# Patient Record
Sex: Male | Born: 1935 | Race: White | Hispanic: No | Marital: Married | Smoking: Never smoker
Health system: Southern US, Community
[De-identification: ages and names within clinical notes are randomized; demographics above are authoritative.]

## PROBLEM LIST (undated history)

## (undated) DIAGNOSIS — I4891 Unspecified atrial fibrillation: Secondary | ICD-10-CM

## (undated) DIAGNOSIS — I10 Essential (primary) hypertension: Secondary | ICD-10-CM

## (undated) DIAGNOSIS — K469 Unspecified abdominal hernia without obstruction or gangrene: Secondary | ICD-10-CM

## (undated) HISTORY — PX: HERNIA REPAIR: SHX51

---

## 2004-08-08 ENCOUNTER — Ambulatory Visit: Payer: Self-pay | Admitting: Surgery

## 2004-08-14 ENCOUNTER — Ambulatory Visit: Payer: Self-pay | Admitting: Surgery

## 2005-02-05 ENCOUNTER — Ambulatory Visit: Payer: Self-pay | Admitting: Ophthalmology

## 2005-02-12 ENCOUNTER — Ambulatory Visit: Payer: Self-pay | Admitting: Ophthalmology

## 2005-03-12 ENCOUNTER — Ambulatory Visit: Payer: Self-pay | Admitting: Ophthalmology

## 2006-04-13 ENCOUNTER — Other Ambulatory Visit: Payer: Self-pay

## 2006-04-13 ENCOUNTER — Inpatient Hospital Stay: Payer: Self-pay | Admitting: Vascular Surgery

## 2006-07-04 ENCOUNTER — Ambulatory Visit: Payer: Self-pay | Admitting: Gastroenterology

## 2006-10-22 ENCOUNTER — Ambulatory Visit: Payer: Self-pay | Admitting: Gastroenterology

## 2008-12-31 ENCOUNTER — Ambulatory Visit: Payer: Self-pay | Admitting: Internal Medicine

## 2009-01-31 ENCOUNTER — Inpatient Hospital Stay: Payer: Self-pay | Admitting: Internal Medicine

## 2009-06-10 ENCOUNTER — Ambulatory Visit: Payer: Self-pay | Admitting: Rheumatology

## 2013-06-16 ENCOUNTER — Encounter: Payer: Self-pay | Admitting: Unknown Physician Specialty

## 2013-06-22 ENCOUNTER — Encounter: Payer: Self-pay | Admitting: Unknown Physician Specialty

## 2013-07-23 ENCOUNTER — Encounter: Payer: Self-pay | Admitting: Unknown Physician Specialty

## 2014-11-10 ENCOUNTER — Ambulatory Visit
Admit: 2014-11-10 | Disposition: A | Discharge status: Home / Self Care | Payer: Self-pay | Attending: Internal Medicine | Admitting: Internal Medicine

## 2014-12-07 ENCOUNTER — Other Ambulatory Visit: Payer: Self-pay | Admitting: Podiatry

## 2014-12-07 DIAGNOSIS — S92252A Displaced fracture of navicular [scaphoid] of left foot, initial encounter for closed fracture: Secondary | ICD-10-CM

## 2014-12-10 ENCOUNTER — Ambulatory Visit
Admission: RE | Admit: 2014-12-10 | Discharge: 2014-12-10 | Disposition: A | Discharge status: Home / Self Care | Payer: Commercial Managed Care - HMO | Source: Ambulatory Visit | Attending: Podiatry | Admitting: Podiatry

## 2014-12-10 DIAGNOSIS — X58XXXA Exposure to other specified factors, initial encounter: Secondary | ICD-10-CM | POA: Insufficient documentation

## 2014-12-10 DIAGNOSIS — S92252A Displaced fracture of navicular [scaphoid] of left foot, initial encounter for closed fracture: Secondary | ICD-10-CM | POA: Insufficient documentation

## 2015-12-27 ENCOUNTER — Emergency Department: Payer: Medicare HMO

## 2015-12-27 ENCOUNTER — Inpatient Hospital Stay
Admission: EM | Admit: 2015-12-27 | Discharge: 2015-12-31 | DRG: 482 | Disposition: A | Discharge status: Skilled Nursing Facility | Payer: Medicare HMO | Attending: Internal Medicine | Admitting: Internal Medicine

## 2015-12-27 ENCOUNTER — Encounter: Payer: Self-pay | Admitting: *Deleted

## 2015-12-27 DIAGNOSIS — N400 Enlarged prostate without lower urinary tract symptoms: Secondary | ICD-10-CM | POA: Diagnosis present

## 2015-12-27 DIAGNOSIS — M109 Gout, unspecified: Secondary | ICD-10-CM | POA: Diagnosis present

## 2015-12-27 DIAGNOSIS — Z7951 Long term (current) use of inhaled steroids: Secondary | ICD-10-CM | POA: Diagnosis not present

## 2015-12-27 DIAGNOSIS — Y92007 Garden or yard of unspecified non-institutional (private) residence as the place of occurrence of the external cause: Secondary | ICD-10-CM | POA: Diagnosis not present

## 2015-12-27 DIAGNOSIS — E785 Hyperlipidemia, unspecified: Secondary | ICD-10-CM | POA: Diagnosis present

## 2015-12-27 DIAGNOSIS — S72142A Displaced intertrochanteric fracture of left femur, initial encounter for closed fracture: Secondary | ICD-10-CM | POA: Diagnosis present

## 2015-12-27 DIAGNOSIS — I480 Paroxysmal atrial fibrillation: Secondary | ICD-10-CM | POA: Diagnosis present

## 2015-12-27 DIAGNOSIS — D649 Anemia, unspecified: Secondary | ICD-10-CM | POA: Diagnosis present

## 2015-12-27 DIAGNOSIS — I482 Chronic atrial fibrillation: Secondary | ICD-10-CM | POA: Diagnosis present

## 2015-12-27 DIAGNOSIS — Y93H3 Activity, building and construction: Secondary | ICD-10-CM

## 2015-12-27 DIAGNOSIS — Z419 Encounter for procedure for purposes other than remedying health state, unspecified: Secondary | ICD-10-CM

## 2015-12-27 DIAGNOSIS — I129 Hypertensive chronic kidney disease with stage 1 through stage 4 chronic kidney disease, or unspecified chronic kidney disease: Secondary | ICD-10-CM | POA: Diagnosis present

## 2015-12-27 DIAGNOSIS — M1990 Unspecified osteoarthritis, unspecified site: Secondary | ICD-10-CM | POA: Diagnosis present

## 2015-12-27 DIAGNOSIS — W11XXXA Fall on and from ladder, initial encounter: Secondary | ICD-10-CM | POA: Diagnosis present

## 2015-12-27 DIAGNOSIS — Z91040 Latex allergy status: Secondary | ICD-10-CM | POA: Diagnosis not present

## 2015-12-27 DIAGNOSIS — S72009A Fracture of unspecified part of neck of unspecified femur, initial encounter for closed fracture: Secondary | ICD-10-CM | POA: Diagnosis present

## 2015-12-27 HISTORY — DX: Unspecified abdominal hernia without obstruction or gangrene: K46.9

## 2015-12-27 HISTORY — DX: Essential (primary) hypertension: I10

## 2015-12-27 HISTORY — DX: Unspecified atrial fibrillation: I48.91

## 2015-12-27 LAB — TYPE AND SCREEN
ABO/RH(D): O POS
Antibody Screen: NEGATIVE

## 2015-12-27 LAB — CBC WITH DIFFERENTIAL/PLATELET
BASOS ABS: 0 10*3/uL (ref 0–0.1)
Basophils Relative: 0 %
EOS PCT: 2 %
Eosinophils Absolute: 0.2 10*3/uL (ref 0–0.7)
HCT: 37.4 % — ABNORMAL LOW (ref 40.0–52.0)
HEMOGLOBIN: 12.5 g/dL — AB (ref 13.0–18.0)
LYMPHS ABS: 1.6 10*3/uL (ref 1.0–3.6)
Lymphocytes Relative: 18 %
MCH: 34.4 pg — ABNORMAL HIGH (ref 26.0–34.0)
MCHC: 33.4 g/dL (ref 32.0–36.0)
MCV: 103.2 fL — AB (ref 80.0–100.0)
Monocytes Absolute: 0.6 10*3/uL (ref 0.2–1.0)
Monocytes Relative: 7 %
NEUTROS PCT: 73 %
Neutro Abs: 6.6 10*3/uL — ABNORMAL HIGH (ref 1.4–6.5)
PLATELETS: 171 10*3/uL (ref 150–440)
RBC: 3.63 MIL/uL — AB (ref 4.40–5.90)
RDW: 13.6 % (ref 11.5–14.5)
WBC: 9 10*3/uL (ref 3.8–10.6)

## 2015-12-27 LAB — BASIC METABOLIC PANEL
Anion gap: 10 (ref 5–15)
BUN: 37 mg/dL — AB (ref 6–20)
CHLORIDE: 110 mmol/L (ref 101–111)
CO2: 18 mmol/L — ABNORMAL LOW (ref 22–32)
Calcium: 9 mg/dL (ref 8.9–10.3)
Creatinine, Ser: 1.97 mg/dL — ABNORMAL HIGH (ref 0.61–1.24)
GFR, EST AFRICAN AMERICAN: 34 mL/min — AB (ref >60)
GFR, EST NON AFRICAN AMERICAN: 29 mL/min — AB (ref >60)
Glucose, Bld: 120 mg/dL — ABNORMAL HIGH (ref 65–99)
POTASSIUM: 4.7 mmol/L (ref 3.5–5.1)
SODIUM: 138 mmol/L (ref 135–145)

## 2015-12-27 LAB — PROTIME-INR
INR: 0.99
Prothrombin Time: 13.3 seconds (ref 11.4–15.0)

## 2015-12-27 LAB — APTT: APTT: 30 s (ref 24–36)

## 2015-12-27 LAB — ALBUMIN: Albumin: 4 g/dL (ref 3.5–5.0)

## 2015-12-27 MED ORDER — MORPHINE SULFATE (PF) 2 MG/ML IV SOLN: 1.0000 mg | INTRAVENOUS | Status: DC | PRN

## 2015-12-27 MED ORDER — ACETAMINOPHEN 325 MG PO TABS: 650.0000 mg | ORAL_TABLET | Freq: Four times a day (QID) | ORAL | Status: DC | PRN

## 2015-12-27 MED ORDER — MORPHINE SULFATE (PF) 2 MG/ML IV SOLN
2.0000 mg | INTRAVENOUS | Status: DC | PRN
  Administered 2015-12-27: 2 mg via INTRAVENOUS
  Filled 2015-12-27: qty 1

## 2015-12-27 MED ORDER — VITAMIN D 1000 UNITS PO TABS
1000.0000 [IU] | ORAL_TABLET | Freq: Every day | ORAL | Status: DC
  Filled 2015-12-27: qty 1

## 2015-12-27 MED ORDER — OXYCODONE HCL 5 MG PO TABS
5.0000 mg | ORAL_TABLET | ORAL | Status: DC | PRN
  Administered 2015-12-28 (×2): 10 mg via ORAL
  Filled 2015-12-27 (×2): qty 2

## 2015-12-27 MED ORDER — FEBUXOSTAT 40 MG PO TABS
40.0000 mg | ORAL_TABLET | Freq: Every day | ORAL | Status: DC
Start: 2015-12-28 — End: 2015-12-31
  Administered 2015-12-29 – 2015-12-31 (×3): 40 mg via ORAL
  Filled 2015-12-27 (×3): qty 1

## 2015-12-27 MED ORDER — ACETAMINOPHEN 650 MG RE SUPP: 650.0000 mg | Freq: Four times a day (QID) | RECTAL | Status: DC | PRN

## 2015-12-27 MED ORDER — VITAMIN C 500 MG PO TABS
500.0000 mg | ORAL_TABLET | Freq: Every day | ORAL | Status: DC
Start: 2015-12-28 — End: 2015-12-31
  Administered 2015-12-29 – 2015-12-31 (×3): 500 mg via ORAL
  Filled 2015-12-27 (×3): qty 1

## 2015-12-27 MED ORDER — CEFAZOLIN SODIUM-DEXTROSE 2-4 GM/100ML-% IV SOLN
2.0000 g | INTRAVENOUS | Status: AC
  Administered 2015-12-28: 2 g via INTRAVENOUS
  Filled 2015-12-27 (×2): qty 100

## 2015-12-27 MED ORDER — HYDROCODONE-ACETAMINOPHEN 5-325 MG PO TABS: 1.0000 | ORAL_TABLET | ORAL | Status: DC | PRN

## 2015-12-27 MED ORDER — MORPHINE SULFATE (PF) 2 MG/ML IV SOLN
2.0000 mg | Freq: Once | INTRAVENOUS | Status: AC
  Administered 2015-12-27: 2 mg via INTRAVENOUS

## 2015-12-27 MED ORDER — MORPHINE SULFATE (PF) 4 MG/ML IV SOLN: 4.0000 mg | INTRAVENOUS | Status: DC | PRN

## 2015-12-27 MED ORDER — DILTIAZEM HCL ER COATED BEADS 120 MG PO CP24: 120.0000 mg | ORAL_CAPSULE | Freq: Every day | ORAL | Status: DC

## 2015-12-27 MED ORDER — VITAMIN B-12 1000 MCG PO TABS
1000.0000 ug | ORAL_TABLET | Freq: Every day | ORAL | Status: DC
  Administered 2015-12-29 – 2015-12-31 (×3): 1000 ug via ORAL
  Filled 2015-12-27 (×3): qty 1

## 2015-12-27 MED ORDER — SENNOSIDES-DOCUSATE SODIUM 8.6-50 MG PO TABS: 1.0000 | ORAL_TABLET | Freq: Every evening | ORAL | Status: DC | PRN

## 2015-12-27 MED ORDER — MORPHINE SULFATE (PF) 2 MG/ML IV SOLN
INTRAVENOUS | Status: AC
  Filled 2015-12-27: qty 1

## 2015-12-27 MED ORDER — ADULT MULTIVITAMIN W/MINERALS CH
1.0000 | ORAL_TABLET | Freq: Every day | ORAL | Status: DC
  Administered 2015-12-29 – 2015-12-31 (×3): 1 via ORAL
  Filled 2015-12-27 (×3): qty 1

## 2015-12-27 MED ORDER — BISACODYL 5 MG PO TBEC: 5.0000 mg | DELAYED_RELEASE_TABLET | Freq: Every day | ORAL | Status: DC | PRN

## 2015-12-27 MED ORDER — FINASTERIDE 5 MG PO TABS
5.0000 mg | ORAL_TABLET | Freq: Every day | ORAL | Status: DC
  Administered 2015-12-29 – 2015-12-31 (×3): 5 mg via ORAL
  Filled 2015-12-27 (×3): qty 1

## 2015-12-27 MED ORDER — FLUTICASONE PROPIONATE 50 MCG/ACT NA SUSP
2.0000 | Freq: Every evening | NASAL | Status: DC | PRN
  Filled 2015-12-27: qty 16

## 2015-12-27 MED ORDER — SODIUM CHLORIDE 0.9 % IV SOLN
INTRAVENOUS | Status: DC
  Administered 2015-12-27 – 2015-12-28 (×2): via INTRAVENOUS

## 2015-12-27 MED ORDER — VITAMIN E 180 MG (400 UNIT) PO CAPS
400.0000 [IU] | ORAL_CAPSULE | Freq: Every day | ORAL | Status: DC
  Administered 2015-12-29 – 2015-12-31 (×3): 400 [IU] via ORAL
  Filled 2015-12-27 (×3): qty 1

## 2015-12-27 MED ORDER — HYDROMORPHONE HCL 1 MG/ML IJ SOLN
0.5000 mg | Freq: Once | INTRAMUSCULAR | Status: AC
  Administered 2015-12-27: 0.5 mg via INTRAVENOUS
  Filled 2015-12-27: qty 1

## 2015-12-27 NOTE — ED Notes (Signed)
MD Patel at bedside.

## 2015-12-27 NOTE — ED Notes (Signed)
C-collar  Removed by Dr. Cyril LoosenKinner.

## 2015-12-27 NOTE — ED Provider Notes (Signed)
[facility] Emergency Department Provider Note  ____________________________________________    I have reviewed the triage vital signs and the nursing notes.   HISTORY  Chief Complaint Fall    HPI Caleb Walker is a 82 y.o. male who presents after a fall. Patient reports that he was on the second rung of a ladder as he is about to repair his roof but lost his balance and fell backwards and landed on his left hip and left elbow. He complains of significant sharp pain in his left hip when he moves it and is unable to ambulate. He denies head injury. No neck injury   Past Medical History  Diagnosis Date  . Atrial fibrillation (HCC)   . Hypertension   . Hernia, abdominal     There are no active problems to display for this patient.   Past Surgical History  Procedure Laterality Date  . Hernia repair      Current Outpatient Rx  Name  Route  Sig  Dispense  Refill  . aspirin EC 81 MG tablet   Oral   Take 81 mg by mouth daily.         Marland Kitchen diltiazem (CARDIZEM CD) 120 MG 24 hr capsule   Oral   Take 120 mg by mouth daily.         . Febuxostat (ULORIC) 80 MG TABS   Oral   Take 80 mg by mouth daily.         . finasteride (PROSCAR) 5 MG tablet   Oral   Take 5 mg by mouth daily.         . fluticasone (FLONASE) 50 MCG/ACT nasal spray   Each Nare   Place 2 sprays into both nostrils daily.         Marland Kitchen lisinopril-hydrochlorothiazide (PRINZIDE,ZESTORETIC) 20-12.5 MG tablet   Oral   Take 1 tablet by mouth daily.         . Multiple Vitamin (MULTIVITAMIN WITH MINERALS) TABS tablet   Oral   Take 1 tablet by mouth daily.         . vitamin B-12 (CYANOCOBALAMIN) 1000 MCG tablet   Oral   Take 1,000 mcg by mouth daily.           Allergies Latex  No family history on file.  Social History Social History  Substance Use Topics  . Smoking status: Never Smoker   . Smokeless tobacco: None  . Alcohol Use: No    Review of  Systems  Constitutional: Negative for fever. Eyes: Negative for redness ENT: Negative for sore throat Cardiovascular: Negative for chest pain Respiratory: Negative for shortness of breath. Gastrointestinal: Negative for abdominal pain Genitourinary: Negative for dysuria. Musculoskeletal: Left hip pain, no neck pain Skin: Abrasion to left elbow Neurological: Negative for focal weakness Psychiatric: no anxiety    ____________________________________________   PHYSICAL EXAM:  VITAL SIGNS: ED Triage Vitals  Enc Vitals Group     BP 12/27/15 1620 147/53 mmHg     Pulse Rate 12/27/15 1620 59     Resp 12/27/15 1620 18     Temp 12/27/15 1620 97.4 F (36.3 C)     Temp Source 12/27/15 1620 Oral     SpO2 12/27/15 1620 99 %     Weight 12/27/15 1620 205 lb (92.987 kg)     Height 12/27/15 1620  (1.803 m)     Head Cir --      Peak Flow --      Pain  Score --      Pain Loc --      Pain Edu? --      Excl. in GC? --    Constitutional: Alert and oriented. Well appearing and in no distress.  Eyes: Conjunctivae are normal. No erythema or injection ENT   Head: Normocephalic and atraumatic.   Mouth/Throat: Mucous membranes are moist. Cardiovascular: Normal rate, regular rhythm. Normal and symmetric distal pulses are present in the upper extremities.  Respiratory: Normal respiratory effort without tachypnea nor retractions. Breath sounds are clear and equal bilaterally.  Gastrointestinal: Soft and non-tender in all quadrants. No distention. There is no CVA tenderness. Genitourinary: deferred Musculoskeletal: Normal range of motion except in left leg, patient holding it very still. 2+ distal DP pulse left leg. Mild tender to palpation over the left lateral hip, patient is unable to lift the leg without significant pain. No obvious shortening Neurologic:  Normal speech and language. No gross focal neurologic deficits are appreciated. Skin:  Skin is warm, dry and intact. No rash  noted. Psychiatric: Mood and affect are normal. Patient exhibits appropriate insight and judgment.  ____________________________________________    LABS (pertinent positives/negatives)  Labs Reviewed  BASIC METABOLIC PANEL - Abnormal; Notable for the following:    CO2 18 (*)    Glucose, Bld 120 (*)    BUN 37 (*)    Creatinine, Ser 1.97 (*)    GFR calc non Af Amer 29 (*)    GFR calc Af Amer 34 (*)    All other components within normal limits  CBC WITH DIFFERENTIAL/PLATELET - Abnormal; Notable for the following:    RBC 3.63 (*)    Hemoglobin 12.5 (*)    HCT 37.4 (*)    MCV 103.2 (*)    MCH 34.4 (*)    Neutro Abs 6.6 (*)    All other components within normal limits  PROTIME-INR  TYPE AND SCREEN    ____________________________________________   EKG  None  ____________________________________________    RADIOLOGY  Hip x-ray shows mildly displaced intertrochanteric left hip fracture  ____________________________________________   PROCEDURES  Procedure(s) performed: none  Critical Care performed: none  ____________________________________________   INITIAL IMPRESSION / ASSESSMENT AND PLAN / ED COURSE  Pertinent labs & imaging results that were available during my care of the patient were reviewed by me and considered in my medical decision making (see chart for details).  Patient's initial presentation is concerning for hip fracture, we will sent for x-rays and check labs.  ----------------------------------------- 5:41 PM on 12/27/2015 -----------------------------------------  Hip fracture confirmed on x-ray, discussed with Dr. Martha ClanKrasinski orthopedics and will admit to the hospitalist service. Patient remains pain-free and unless he moves his leg. Denied pain medications  ____________________________________________   FINAL CLINICAL IMPRESSION(S) / ED DIAGNOSES  Final diagnoses:  Intertrochanteric fracture of left hip, closed, initial encounter (HCC)           Jene Everyobert Thurley Francesconi, MD 12/27/15 [phone]

## 2015-12-27 NOTE — Progress Notes (Signed)
Pt. Still hurting 10 out of 10. Dilaudid 0.5mg  x1 ordered

## 2015-12-27 NOTE — Consult Note (Cosign Needed)
ORTHOPAEDIC CONSULTATION  REQUESTING PHYSICIAN: Enedina Finner, MD  Chief Complaint: Left hip pain  HPI: Caleb Walker is a 82 y.o. male who complains of left hip pain after a fall of the second rung of a ladder at home.  He states he doesn't know why he fell, but that maybe his left lower extremity "gave out" on him.  He landed on his left elbow and hip.  He couldn't stand after the fall.  He states that he has had weakness in his lower extremities prior to his fall today.   He has performed PT for this without resolution.  He states the etiology of his weakness is unknown.    Past Medical History  Diagnosis Date  . Atrial fibrillation (HCC)   . Hypertension   . Hernia, abdominal    Past Surgical History  Procedure Laterality Date  . Hernia repair     Social History   Social History  . Marital Status: Married    Spouse Name: N/A  . Number of Children: N/A  . Years of Education: N/A   Social History Main Topics  . Smoking status: Never Smoker   . Smokeless tobacco: None  . Alcohol Use: No  . Drug Use: None  . Sexual Activity: Not Asked   Other Topics Concern  . None   Social History Narrative  . None   No family history on file. Allergies  Allergen Reactions  . Latex Rash   Prior to Admission medications   Medication Sig Start Date End Date Taking? Authorizing Provider  cholecalciferol (VITAMIN D) 1000 units tablet Take 1,000 Units by mouth daily.   Yes Historical Provider, MD  diltiazem (CARDIZEM CD) 120 MG 24 hr capsule Take 120 mg by mouth daily.   Yes Historical Provider, MD  febuxostat (ULORIC) 40 MG tablet Take 40 mg by mouth daily.   Yes Historical Provider, MD  finasteride (PROSCAR) 5 MG tablet Take 5 mg by mouth daily.   Yes Historical Provider, MD  fluticasone (FLONASE) 50 MCG/ACT nasal spray Place 2 sprays into both nostrils at bedtime as needed for rhinitis.    Yes Historical Provider, MD  Multiple Vitamin (MULTIVITAMIN WITH MINERALS) TABS tablet Take 1  tablet by mouth daily.   Yes Historical Provider, MD  vitamin B-12 (CYANOCOBALAMIN) 1000 MCG tablet Take 1,000 mcg by mouth daily.   Yes Historical Provider, MD  vitamin C (ASCORBIC ACID) 500 MG tablet Take 500 mg by mouth daily.   Yes Historical Provider, MD  vitamin E 400 UNIT capsule Take 400 Units by mouth daily.   Yes Historical Provider, MD   Dg Chest 1 View  12/27/2015  CLINICAL DATA:  Left hip fracture.  Pre-op respiratory exam EXAM: CHEST 1 VIEW COMPARISON:  01/31/2009 FINDINGS: The heart size and mediastinal contours are within normal limits. Both lungs are clear. The visualized skeletal structures are unremarkable. IMPRESSION: No active disease. Electronically Signed   By: Myles Rosenthal M.D.   On: 12/27/2015 17:15   Dg Hip Unilat With Pelvis 2-3 Views Left  12/27/2015  CLINICAL DATA:  Fall off ladder. Left hip injury and pain. Initial encounter. EXAM: DG HIP (WITH OR WITHOUT PELVIS) 2-3V LEFT COMPARISON:  None. FINDINGS: Mildly displaced intertrochanteric left hip fracture is seen, which remains in near anatomic alignment. No evidence of hip dislocation. No pelvic fracture identified. Surgical mesh seen in right inguinal region. Generalized osteopenia noted. IMPRESSION: Mildly displaced intertrochanteric left hip fracture. Electronically Signed   By: Alver Sorrow.D.  On: 12/27/2015 17:16    Positive ROS: All other systems have been reviewed and were otherwise negative with the exception of those mentioned in the HPI and as above.  Physical Exam: General: Alert, no acute distress   MUSCULOSKELETAL: Left lower extremity:  It is intact. There is no erythema or ecchymosis or swelling. His thigh and lower leg compartments are soft and compressible. He has slight shortening and external rotation of the left lower extremity. His palpable pedal pulses, intact sensation light touch and intact motor function throughout both lower extremity.  Assessment: Left intertrochanteric hip  fracture  Plan: Patient will require surgical intervention for this intertrochanteric hip fracture in order for the patient to stand or ambulate. Patient is being admitted to the medical service for preoperative clearance. Patient is scheduled for surgery tomorrow morning pending medical clearance.   The risks, benefits and alternatives were discussed with the patient and their family.  The risks include but are not limited to infection, bleeding, nerve or blood vessel injury, malunion, nonunion, hardware prominence, hardware failure, change in leg lengths or lower extremity rotation need for further surgery including hardware removal with conversion to a total hip arthroplasty. Medical risks include but are not limited to DVT and pulmonary embolism, myocardial infarction, stroke, pneumonia, respiratory failure and death. The patient and their family understood these risks and wished to proceed with surgery.  I have reviewed the patient's laboratories as well as the radiographic studies in preparation for this surgery.  12/27/2015 6:36 PM

## 2015-12-27 NOTE — ED Notes (Signed)
Per EMS report, patient was standing on 2nd rung of ladder leaning against the house and fell backwards. Patient states he landed on left side and hit his head, elbow and hip. Patient then crawled to his stoop and sat there for approximately one hour until someone noticed him and could call for an ambulance. Patient has an abrasion on left elbow, and c/o left hip pain and could not weight bear for the EMS. Patient denies being on blood thinners. C-collar placed on patient upon arrival.

## 2015-12-27 NOTE — Progress Notes (Signed)
Pt. Hurting 10 out of 10 upon arrival from ED. Dr. Imogene Burnhen ordered morphine 2 mg now and 4mg  q4h prn

## 2015-12-27 NOTE — H&P (Signed)
[facility] [place] [facility]   PATIENT NAME: Caleb Walker Walker    MR#:  [id number]  DATE OF BIRTH:  06/09/1927  DATE OF ADMISSION:  12/27/2015  PRIMARY CARE PHYSICIAN: Lauro RegulusANDERSON,MARSHALL W., MD   REQUESTING/REFERRING PHYSICIAN: Dr. Cyril LoosenKinner  CHIEF COMPLAINT:  Left hip pain status post fall from a ladder today  HISTORY OF PRESENT ILLNESS:  Caleb Walker  is a 82 y.o. male with a known history of Paroxysmal A. fib, gout, tenacious anemia, osteoarthritis, hyperlipidemia, chronic kidney disease stage III carcinoid emergency room after he had sustained a fall at home this morning after he had fallen from the ladder. Patient was trying to fix his daughters and lost balance. He denies losing consciousness feeling dizzy lightheaded prior to getting up on the lateral. He was having his normal day. Denies any cardiac history other than paroxysmal A. fib. He is not on any blood thinners and does not even take an aspirin. Patient remains in sinus rhythm in the emergency room he is hemodynamically stable blood pressures a bit on the higher side secondary to hip pain and is being admitted for further evaluation and management of his left hip intertrochanteric fracture. No family is present emergency room.  PAST MEDICAL HISTORY:   Past Medical History  Diagnosis Date  . Atrial fibrillation (HCC)   . Hypertension   . Hernia, abdominal     PAST SURGICAL HISTOIRY:   Past Surgical History  Procedure Laterality Date  . Hernia repair      SOCIAL HISTORY:   Social History  Substance Use Topics  . Smoking status: Never Smoker   . Smokeless tobacco: Not on file  . Alcohol Use: No    FAMILY HISTORY:  No family history on file.  DRUG ALLERGIES:   Allergies  Allergen Reactions  . Latex Rash    REVIEW OF SYSTEMS:  Review of Systems  Constitutional: Negative for fever, chills and weight loss.  HENT: Negative for ear discharge, ear pain and nosebleeds.   Eyes:  Negative for blurred vision, pain and discharge.  Respiratory: Negative for sputum production, shortness of breath, wheezing and stridor.   Cardiovascular: Negative for chest pain, palpitations, orthopnea and PND.  Gastrointestinal: Negative for nausea, vomiting, abdominal pain and diarrhea.  Genitourinary: Negative for urgency and frequency.  Musculoskeletal: Positive for joint pain. Negative for back pain.  Neurological: Positive for weakness. Negative for sensory change, speech change and focal weakness.  Psychiatric/Behavioral: Negative for depression and hallucinations. The patient is not nervous/anxious.   All other systems reviewed and are negative.    MEDICATIONS AT HOME:   Prior to Admission medications   Medication Sig Start Date End Date Taking? Authorizing Provider  cholecalciferol (VITAMIN D) 1000 units tablet Take 1,000 Units by mouth daily.   Yes Historical Provider, MD  diltiazem (CARDIZEM CD) 120 MG 24 hr capsule Take 120 mg by mouth daily.   Yes Historical Provider, MD  febuxostat (ULORIC) 40 MG tablet Take 40 mg by mouth daily.   Yes Historical Provider, MD  finasteride (PROSCAR) 5 MG tablet Take 5 mg by mouth daily.   Yes Historical Provider, MD  fluticasone (FLONASE) 50 MCG/ACT nasal spray Place 2 sprays into both nostrils at bedtime as needed for rhinitis.    Yes Historical Provider, MD  Multiple Vitamin (MULTIVITAMIN WITH MINERALS) TABS tablet Take 1 tablet by mouth daily.   Yes Historical Provider, MD  vitamin B-12 (CYANOCOBALAMIN) 1000 MCG tablet Take 1,000 mcg by mouth daily.  Yes Historical Provider, MD  vitamin C (ASCORBIC ACID) 500 MG tablet Take 500 mg by mouth daily.   Yes Historical Provider, MD  vitamin E 400 UNIT capsule Take 400 Units by mouth daily.   Yes Historical Provider, MD      VITAL SIGNS:  Blood pressure 170/80, pulse 69, temperature 97.4 F (36.3 C), temperature source Oral, resp. rate 14, height  (1.803 m), weight 92.987 kg (205 lb),  SpO2 100 %.  PHYSICAL EXAMINATION:  GENERAL:  82 y.o.-year-old patient lying in the bed with no acute distress.  EYES: Pupils equal, round, reactive to light and accommodation. No scleral icterus. Extraocular muscles intact.  HEENT: Head atraumatic, normocephalic. Oropharynx and nasopharynx clear.  NECK:  Supple, no jugular venous distention. No thyroid enlargement, no tenderness.  LUNGS: Normal breath sounds bilaterally, no wheezing, rales,rhonchi or crepitation. No use of accessory muscles of respiration.  CARDIOVASCULAR: S1, S2 normal. No murmurs, rubs, or gallops.  ABDOMEN: Soft, nontender, nondistended. Bowel sounds present. No organomegaly or mass.  EXTREMITIES: No pedal edema, cyanosis, or clubbing. Decreased range of motion on left hip NEUROLOGIC: Cranial nerves II through XII are intact. Muscle strength 5/5 in 3 extremities but left hip. Sensation intact. Gait not checked.  PSYCHIATRIC: The patient is alert and oriented x 3.  SKIN: No obvious rash, lesion, or ulcer.   LABORATORY PANEL:   CBC  Recent Labs Lab 12/27/15 1634  WBC 9.0  HGB 12.5*  HCT 37.4*  PLT 171   ------------------------------------------------------------------------------------------------------------------  Chemistries   Recent Labs Lab 12/27/15 1634  NA 138  K 4.7  CL 110  CO2 18*  GLUCOSE 120*  BUN 37*  CREATININE 1.97*  CALCIUM 9.0   ------------------------------------------------------------------------------------------------------------------  Cardiac Enzymes No results for input(s): TROPONINI in the last 168 hours. ------------------------------------------------------------------------------------------------------------------  RADIOLOGY:  Dg Chest 1 View  12/27/2015  CLINICAL DATA:  Left hip fracture.  Pre-op respiratory exam EXAM: CHEST 1 VIEW COMPARISON:  01/31/2009 FINDINGS: The heart size and mediastinal contours are within normal limits. Both lungs are clear. The visualized  skeletal structures are unremarkable. IMPRESSION: No active disease. Electronically Signed   By: Myles Rosenthal M.D.   On: 12/27/2015 17:15   Dg Hip Unilat With Pelvis 2-3 Views Left  12/27/2015  CLINICAL DATA:  Fall off ladder. Left hip injury and pain. Initial encounter. EXAM: DG HIP (WITH OR WITHOUT PELVIS) 2-3V LEFT COMPARISON:  None. FINDINGS: Mildly displaced intertrochanteric left hip fracture is seen, which remains in near anatomic alignment. No evidence of hip dislocation. No pelvic fracture identified. Surgical mesh seen in right inguinal region. Generalized osteopenia noted. IMPRESSION: Mildly displaced intertrochanteric left hip fracture. Electronically Signed   By: Myles Rosenthal M.D.   On: 12/27/2015 17:16    EKG:   Normal sinus rhythm. No acute EKG changes IMPRESSION AND PLAN:   Caleb Walker  is a 82 y.o. male with a known history of Paroxysmal A. fib, gout, tenacious anemia, osteoarthritis, hyperlipidemia, chronic kidney disease stage III carcinoid emergency room after he had sustained a fall at home this morning after he had fallen from the ladder. Patient was trying to fix his daughters and lost balance. He denies losing consciousness feeling dizzy lightheaded prior to getting up on the lateral.   1. Acute left hip intertrochanteric fracture status post mechanical fall at home while patient was trying to climb up the ladder to clean his gutters -Patient does not have any cardiac history other than paroxysmal A. fib not on any blood thinners -  He is otherwise active and has good functional capacity. -EKG shows normal sinus rhythm -Patient is at a low to intermediate risk for surgery. Okay to proceed with surgery. Patient denies any chest pain or shortness of breath  2. Paroxysmal A. Fib -Currently in sinus rhythm -Continue calcium channel blocker  3. Hypertension Continue diltiazem -IV when necessary hydralazine  4. Chronic kidney disease stage III -IV fluids maintenance  5.  DVT prophylaxis SCD and teds. Will defer it to orthopedic to start Lovenox after surgery.  All the records are reviewed and case discussed with ED provider. Management plans discussed with the patient, family and they are in agreement.  CODE STATUS: Full this was discussed with patient  TOTAL TIME TAKING CARE OF THIS PATIENT: 50 minutes.    Bathsheba Durrett M.D on 12/27/2015 at 5:55 PM  Between 7am to 6pm - Pager - [phone]  After 6pm go to [email or web address] - password [email or web address]  [facility]  Office  [phone]  CC: Primary care physician; Lauro Regulus., MD

## 2015-12-27 NOTE — ED Notes (Signed)
Patient is back from x-ray and was given phone to call his son. Patient is alert and oriented, hard of hearing, pleasant and cooperative.

## 2015-12-28 ENCOUNTER — Inpatient Hospital Stay: Payer: Medicare HMO

## 2015-12-28 ENCOUNTER — Encounter
Admission: EM | Disposition: A | Discharge status: Skilled Nursing Facility | Payer: Self-pay | Source: Home / Self Care | Attending: Internal Medicine

## 2015-12-28 ENCOUNTER — Inpatient Hospital Stay: Payer: Medicare HMO | Admitting: Anesthesiology

## 2015-12-28 ENCOUNTER — Encounter: Payer: Self-pay | Admitting: *Deleted

## 2015-12-28 HISTORY — PX: INTRAMEDULLARY (IM) NAIL INTERTROCHANTERIC: SHX5875

## 2015-12-28 LAB — CBC
HCT: 32.5 % — ABNORMAL LOW (ref 40.0–52.0)
Hemoglobin: 10.9 g/dL — ABNORMAL LOW (ref 13.0–18.0)
MCH: 33.9 pg (ref 26.0–34.0)
MCHC: 33.4 g/dL (ref 32.0–36.0)
MCV: 101.3 fL — AB (ref 80.0–100.0)
PLATELETS: 157 10*3/uL (ref 150–440)
RBC: 3.21 MIL/uL — ABNORMAL LOW (ref 4.40–5.90)
RDW: 13.8 % (ref 11.5–14.5)
WBC: 11.1 10*3/uL — ABNORMAL HIGH (ref 3.8–10.6)

## 2015-12-28 LAB — SURGICAL PCR SCREEN
MRSA, PCR: NEGATIVE
STAPHYLOCOCCUS AUREUS: POSITIVE — AB

## 2015-12-28 SURGERY — FIXATION, FRACTURE, INTERTROCHANTERIC, WITH INTRAMEDULLARY ROD
Anesthesia: Regional | Laterality: Left

## 2015-12-28 MED ORDER — FENTANYL CITRATE (PF) 100 MCG/2ML IJ SOLN: 25.0000 ug | INTRAMUSCULAR | Status: DC | PRN

## 2015-12-28 MED ORDER — MENTHOL 3 MG MT LOZG
1.0000 | LOZENGE | OROMUCOSAL | Status: DC | PRN
  Filled 2015-12-28: qty 9

## 2015-12-28 MED ORDER — ONDANSETRON HCL 4 MG PO TABS: 4.0000 mg | ORAL_TABLET | Freq: Four times a day (QID) | ORAL | Status: DC | PRN

## 2015-12-28 MED ORDER — ACETAMINOPHEN 650 MG RE SUPP: 650.0000 mg | Freq: Four times a day (QID) | RECTAL | Status: DC | PRN

## 2015-12-28 MED ORDER — FENTANYL CITRATE (PF) 100 MCG/2ML IJ SOLN
INTRAMUSCULAR | Status: DC | PRN
  Administered 2015-12-28 (×2): 25 ug via INTRAVENOUS

## 2015-12-28 MED ORDER — ENOXAPARIN SODIUM 40 MG/0.4ML ~~LOC~~ SOLN
40.0000 mg | SUBCUTANEOUS | Status: DC
  Administered 2015-12-29 – 2015-12-31 (×3): 40 mg via SUBCUTANEOUS
  Filled 2015-12-28 (×3): qty 0.4

## 2015-12-28 MED ORDER — PHENOL 1.4 % MT LIQD
1.0000 | OROMUCOSAL | Status: DC | PRN
  Filled 2015-12-28: qty 177

## 2015-12-28 MED ORDER — ONDANSETRON HCL 4 MG/2ML IJ SOLN: 4.0000 mg | Freq: Four times a day (QID) | INTRAMUSCULAR | Status: DC | PRN

## 2015-12-28 MED ORDER — BUPIVACAINE HCL (PF) 0.5 % IJ SOLN
INTRAMUSCULAR | Status: DC | PRN
  Administered 2015-12-28: 3 mL

## 2015-12-28 MED ORDER — NEOMYCIN-POLYMYXIN B GU 40-200000 IR SOLN
Status: DC | PRN
  Administered 2015-12-28: 4 mL

## 2015-12-28 MED ORDER — PHENYLEPHRINE HCL 10 MG/ML IJ SOLN
INTRAMUSCULAR | Status: DC | PRN
  Administered 2015-12-28 (×4): 50 ug via INTRAVENOUS

## 2015-12-28 MED ORDER — NEOMYCIN-POLYMYXIN B GU 40-200000 IR SOLN
Status: AC
  Filled 2015-12-28: qty 4

## 2015-12-28 MED ORDER — SODIUM CHLORIDE FLUSH 0.9 % IV SOLN
INTRAVENOUS | Status: AC
Start: 2015-12-28 — End: 2015-12-28
  Filled 2015-12-28: qty 10

## 2015-12-28 MED ORDER — FERROUS SULFATE 325 (65 FE) MG PO TABS
325.0000 mg | ORAL_TABLET | Freq: Three times a day (TID) | ORAL | Status: DC
  Administered 2015-12-29 – 2015-12-31 (×7): 325 mg via ORAL
  Filled 2015-12-28 (×7): qty 1

## 2015-12-28 MED ORDER — PROPOFOL 500 MG/50ML IV EMUL
INTRAVENOUS | Status: DC | PRN
  Administered 2015-12-28: 40 ug/kg/min via INTRAVENOUS

## 2015-12-28 MED ORDER — SODIUM CHLORIDE 0.9 % IV SOLN
75.0000 mL/h | INTRAVENOUS | Status: DC
  Administered 2015-12-28 – 2015-12-29 (×2): 75 mL/h via INTRAVENOUS

## 2015-12-28 MED ORDER — CHLORHEXIDINE GLUCONATE CLOTH 2 % EX PADS: 6.0000 | MEDICATED_PAD | Freq: Every day | CUTANEOUS | Status: DC

## 2015-12-28 MED ORDER — HYDROMORPHONE HCL 1 MG/ML IJ SOLN
1.0000 mg | INTRAMUSCULAR | Status: DC | PRN
  Administered 2015-12-28: 1 mg via INTRAVENOUS
  Filled 2015-12-28 (×2): qty 1

## 2015-12-28 MED ORDER — MUPIROCIN 2 % EX OINT
1.0000 "application " | TOPICAL_OINTMENT | Freq: Two times a day (BID) | CUTANEOUS | Status: DC
  Filled 2015-12-28: qty 22

## 2015-12-28 MED ORDER — OXYCODONE HCL 5 MG PO TABS
10.0000 mg | ORAL_TABLET | ORAL | Status: DC | PRN
  Administered 2015-12-28 (×2): 10 mg via ORAL
  Administered 2015-12-29: 15 mg via ORAL
  Administered 2015-12-29 – 2015-12-31 (×5): 10 mg via ORAL
  Filled 2015-12-28 (×6): qty 2
  Filled 2015-12-28: qty 3
  Filled 2015-12-28 (×2): qty 2

## 2015-12-28 MED ORDER — EPHEDRINE SULFATE 50 MG/ML IJ SOLN
INTRAMUSCULAR | Status: DC | PRN
  Administered 2015-12-28: 10 mg via INTRAVENOUS
  Administered 2015-12-28: 5 mg via INTRAVENOUS
  Administered 2015-12-28 (×2): 10 mg via INTRAVENOUS

## 2015-12-28 MED ORDER — BISACODYL 10 MG RE SUPP
10.0000 mg | Freq: Every day | RECTAL | Status: DC | PRN
  Administered 2015-12-30: 10 mg via RECTAL
  Filled 2015-12-28: qty 1

## 2015-12-28 MED ORDER — ACETAMINOPHEN 325 MG PO TABS
650.0000 mg | ORAL_TABLET | Freq: Four times a day (QID) | ORAL | Status: DC | PRN
  Administered 2015-12-29: 650 mg via ORAL
  Filled 2015-12-28: qty 2

## 2015-12-28 MED ORDER — SENNA 8.6 MG PO TABS
1.0000 | ORAL_TABLET | Freq: Two times a day (BID) | ORAL | Status: DC
Start: 2015-12-28 — End: 2015-12-31
  Administered 2015-12-28 – 2015-12-31 (×6): 8.6 mg via ORAL
  Filled 2015-12-28 (×6): qty 1

## 2015-12-28 MED ORDER — POLYETHYLENE GLYCOL 3350 17 G PO PACK
17.0000 g | PACK | Freq: Every day | ORAL | Status: DC | PRN
  Administered 2015-12-29: 17 g via ORAL
  Filled 2015-12-28: qty 1

## 2015-12-28 MED ORDER — ALUM & MAG HYDROXIDE-SIMETH 200-200-20 MG/5ML PO SUSP: 30.0000 mL | ORAL | Status: DC | PRN

## 2015-12-28 MED ORDER — GLYCOPYRROLATE 0.2 MG/ML IJ SOLN
INTRAMUSCULAR | Status: DC | PRN
  Administered 2015-12-28: 0.2 mg via INTRAVENOUS

## 2015-12-28 MED ORDER — PROPOFOL 10 MG/ML IV BOLUS
INTRAVENOUS | Status: DC | PRN
  Administered 2015-12-28: 20 mg via INTRAVENOUS
  Administered 2015-12-28: 30 mg via INTRAVENOUS
  Administered 2015-12-28: 20 mg via INTRAVENOUS

## 2015-12-28 MED ORDER — MAGNESIUM CITRATE PO SOLN
1.0000 | Freq: Once | ORAL | Status: DC | PRN
  Filled 2015-12-28: qty 296

## 2015-12-28 MED ORDER — DOCUSATE SODIUM 100 MG PO CAPS
100.0000 mg | ORAL_CAPSULE | Freq: Two times a day (BID) | ORAL | Status: DC
  Administered 2015-12-28 – 2015-12-31 (×6): 100 mg via ORAL
  Filled 2015-12-28 (×6): qty 1

## 2015-12-28 MED ORDER — ONDANSETRON HCL 4 MG/2ML IJ SOLN: 4.0000 mg | Freq: Once | INTRAMUSCULAR | Status: DC | PRN

## 2015-12-28 MED ORDER — MORPHINE SULFATE (PF) 2 MG/ML IV SOLN: 2.0000 mg | INTRAVENOUS | Status: DC | PRN

## 2015-12-28 MED ORDER — LACTATED RINGERS IV SOLN
INTRAVENOUS | Status: DC
Start: 2015-12-28 — End: 2015-12-28
  Administered 2015-12-28: 09:00:00 via INTRAVENOUS

## 2015-12-28 MED ORDER — CEFAZOLIN SODIUM-DEXTROSE 2-4 GM/100ML-% IV SOLN
2.0000 g | Freq: Four times a day (QID) | INTRAVENOUS | Status: AC
  Administered 2015-12-28: 2 g via INTRAVENOUS
  Filled 2015-12-28 (×2): qty 100

## 2015-12-28 SURGICAL SUPPLY — 34 items
BIT DRILL CANN LG 4.3MM (BIT) ×1 IMPLANT
BNDG COHESIVE 6X5 TAN STRL LF (GAUZE/BANDAGES/DRESSINGS) ×6 IMPLANT
CANISTER SUCT 1200ML W/VALVE (MISCELLANEOUS) ×3 IMPLANT
DRAPE SHEET LG 3/4 BI-LAMINATE (DRAPES) ×6 IMPLANT
DRAPE SURG 17X11 SM STRL (DRAPES) ×6 IMPLANT
DRAPE U-SHAPE 47X51 STRL (DRAPES) ×3 IMPLANT
DRILL BIT CANN LG 4.3MM (BIT) ×3
DRSG OPSITE POSTOP 4X14 (GAUZE/BANDAGES/DRESSINGS) ×3 IMPLANT
DURAPREP 26ML APPLICATOR (WOUND CARE) ×6 IMPLANT
ELECT REM PT RETURN 9FT ADLT (ELECTROSURGICAL) ×3
ELECTRODE REM PT RTRN 9FT ADLT (ELECTROSURGICAL) ×1 IMPLANT
GLOVE BIOGEL PI IND STRL 9 (GLOVE) ×1 IMPLANT
GLOVE BIOGEL PI INDICATOR 9 (GLOVE) ×2
GLOVE SURG 9.0 ORTHO LTXF (GLOVE) ×6 IMPLANT
GOWN STRL REUS TWL 2XL XL LVL4 (GOWN DISPOSABLE) ×3 IMPLANT
GOWN STRL REUS W/ TWL LRG LVL3 (GOWN DISPOSABLE) ×1 IMPLANT
GOWN STRL REUS W/TWL LRG LVL3 (GOWN DISPOSABLE) ×2
GUIDEPIN VERSANAIL DSP 3.2X444 ×3 IMPLANT
HEMOVAC 400CC 10FR (MISCELLANEOUS) ×3 IMPLANT
HFN LAG SCREW 10.5MM X 115MM (Orthopedic Implant) ×3 IMPLANT
KIT RM TURNOVER CYSTO AR (KITS) ×3 IMPLANT
MAT BLUE FLOOR 46X72 FLO (MISCELLANEOUS) ×3 IMPLANT
NAIL HIP FRACT 130D 11X180 (Screw) ×3 IMPLANT
NS IRRIG 1000ML POUR BTL (IV SOLUTION) ×3 IMPLANT
PACK HIP COMPR (MISCELLANEOUS) ×3 IMPLANT
SCREW BONE CORTICAL 5.0X42 (Screw) ×3 IMPLANT
STAPLER SKIN PROX 35W (STAPLE) ×3 IMPLANT
SUCTION FRAZIER HANDLE 10FR (MISCELLANEOUS) ×2
SUCTION TUBE FRAZIER 10FR DISP (MISCELLANEOUS) ×1 IMPLANT
SUT VIC AB 0 CT1 36 (SUTURE) ×6 IMPLANT
SUT VIC AB 2-0 CT1 27 (SUTURE) ×2
SUT VIC AB 2-0 CT1 TAPERPNT 27 (SUTURE) ×1 IMPLANT
SUT VICRYL 0 AB UR-6 (SUTURE) ×6 IMPLANT
SYR 30ML LL (SYRINGE) ×3 IMPLANT

## 2015-12-28 NOTE — Anesthesia Preprocedure Evaluation (Addendum)
Anesthesia Evaluation  Patient identified by MRN, date of birth, ID band Patient awake    Reviewed: Allergy & Precautions, H&P , NPO status , Patient's Chart, lab work & pertinent test results, reviewed documented beta blocker date and time   Airway Mallampati: III  TM Distance: >3 FB Neck ROM: full    Dental no notable dental hx. (+) Teeth Intact   Pulmonary neg pulmonary ROS,    Pulmonary exam normal breath sounds clear to auscultation       Cardiovascular Exercise Tolerance: Good hypertension, negative cardio ROS Normal cardiovascular exam Rhythm:regular Rate:Normal     Neuro/Psych negative neurological ROS  negative psych ROS   GI/Hepatic negative GI ROS, Neg liver ROS,   Endo/Other  negative endocrine ROS  Renal/GU negative Renal ROS  negative genitourinary   Musculoskeletal   Abdominal   Peds  Hematology negative hematology ROS (+)   Anesthesia Other Findings   Reproductive/Obstetrics                           Anesthesia Physical Anesthesia Plan  ASA: III and emergent  Anesthesia Plan: Regional and Spinal   Post-op Pain Management:    Induction:   Airway Management Planned: Simple Face Mask  Additional Equipment:   Intra-op Plan:   Post-operative Plan:   Informed Consent: I have reviewed the patients History and Physical, chart, labs and discussed the procedure including the risks, benefits and alternatives for the proposed anesthesia with the patient or authorized representative who has indicated his/her understanding and acceptance.     Plan Discussed with: CRNA  Anesthesia Plan Comments:        Anesthesia Quick Evaluation

## 2015-12-28 NOTE — Progress Notes (Signed)
Pt with pulse low 46.  Pt asymptomatic . rn reported low pulse to dr Cherlynn Kaisersainani and kim in [facility] who will check with anesthesiologist

## 2015-12-28 NOTE — Op Note (Addendum)
DATE OF SURGERY:  12/28/2015  TIME: 1:13 PM  PATIENT NAME:  Caleb Walker  AGE: 82 y.o.  PRE-OPERATIVE DIAGNOSIS:  fractured left hip  POST-OPERATIVE DIAGNOSIS:  SAME  PROCEDURE: LEFT HIP INTRAMEDULLARY (IM) FIXATION OF INTERTROCHANTRIC HIP FRACTURE  SURGEON:  Juanell Fairly  OPERATIVE IMPLANTS: Biomet short Affixus nail 11 x , 115 mm lag screw with a 42 mm distal interlocking screw  PREOPERATIVE INDICATIONS:  Caleb Walker is a 82 y.o. year old who fell and suffered a left hip fracture. He was brought into the ER and then admitted and medically cleared for surgical intervention.    The risks, benefits and alternatives were discussed with the patient.  The risks include but are not limited to infection, bleeding, nerve or blood vessel injury, malunion, nonunion, hardware prominence, hardware failure, change in leg lengths or lower extremity rotation need for further surgery including hardware removal with conversion to a total hip arthroplasty. Medical risks include but are not limited to DVT and pulmonary embolism, myocardial infarction, stroke, pneumonia, respiratory failure and death. The patient and their family understood these risks and wished to proceed with surgery.  OPERATIVE PROCEDURE:  The patient was brought to the operating room and placed in the supine position on the fracture table. Spinal anesthesia was administered.  A closed reduction was performed under C-arm guidance.  The fracture reduction was confirmed on both AP and lateral views. A time out was performed to verify the patient's name, date of birth, medical record number, correct site of surgery correct procedure to be performed. The timeout was also used to verify the patient received antibiotics and all appropriate instruments, implants and radiographic studies were available in the room. Once all in attendance were in agreement, the case began. The patient was prepped and draped in a sterile fashion. He  received preoperative antibiotics of 2grams of Ancef.  An incision was made proximal to the greater trochanter in line with the femur. A guidewire was placed over the tip of the greater trochanter and advanced into the proximal femur to the level of the lesser trochanter.  Confirmation of the drill pin position was made on AP and lateral C-arm images.  The threaded guidepin was then overdrilled with the proximal femoral drill.  The nail was then inserted into the proximal femur, across the fracture site and into the femoral shaft. Its position was confirmed on AP and lateral C-arm images.   Once the nail was completely seated, the drill guide for the lag screw was placed through the guide arm for the Affixus nail. A guidepin was then placed through this drill guide and advanced through the lateral cortex of the femur, across the fracture site and into the femoral head achieving a tip apex distance of less than 25 mm. The length of the drill pin was measured to , and then the drill for the lag screw was advanced through the lateral cortex, across the fracture site and up into the femoral head to the depth of the lag screw.  The lag screw was then advanced by hand into position across the fracture site into the femoral head. Its final position was confirmed on AP and lateral C-arm images. Compression was applied as traction was carefully released. The set screw in the top of the intramedullary rod was tightened by hand using a screwdriver. It was backed off a quarter turn to allow for compression at the fracture site.  The drill sleeve for the distal interlocking screw was  then placed through the Affixus guide arm. A small stab incision was made to allow the drill guide to approximate the lateral cortex of the femur. The drill for the distal interlocking screw was then advanced bicortically. The depth of this drill was measured to 42mm. A distal interlocking screw with the length measured was then  inserted by hand through the guide arm. Final C-arm images of the entire intramedullary construct were taken in both the AP and lateral planes.   The wounds were irrigated copiously and closed with 0 Vicryl for closure of the deep fascia and 2-0 Vicryl for subcutaneous closure. The skin was approximated with staples. A dry sterile dressing was applied. I was scrubbed and present the entire case and all sharp and instrument counts were correct at the conclusion of the case. Patient was transferred to hospital bed and brought to PACU in stable condition.   He will be partial weightbearing and begin physical and occupational therapy tomorrow. The patient will started on medical DVT prophylaxis tomorrow.    Kathreen DevoidKevin L Antoine Fiallos, MD

## 2015-12-28 NOTE — Progress Notes (Signed)
PT Cancellation Note  Patient Details Name: Caleb GuerinJames C Conran MRN: [id number] DOB: 07/15/1927   Cancelled Treatment:    Reason Eval/Treat Not Completed: Patient not medically ready;Other (comment). Pt's chart reviewed. He is scheduled for surgery today on his hip fracture and is not appropriate for PT evaluation at this time. Current orders will be completed and new orders will be required for post surgery. PT will f/u and complete evaluation when appropriate.   Adelene IdlerMindy Jo Bodee Lafoe, PT, DPT  12/28/2015, 8:18 AM [phone]

## 2015-12-28 NOTE — Clinical Social Work Note (Signed)
Clinical Social Work Assessment  Patient Details  Name: Caleb Walker MRN: [id number] Date of Birth: [birth year]  Date of referral:  12/28/15               Reason for consult:  Facility Placement                Permission sought to share information with:  Family Supports Permission granted to share information::  Yes, Verbal Permission Granted  Name::     Caleb Walker, son   Housing/Transportation Living arrangements for the past 2 months:  Single Family Home Source of Information:  Adult Children Patient Interpreter Needed:  None Criminal Activity/Legal Involvement Pertinent to Current Situation/Hospitalization:  No - Comment as needed Significant Relationships:  Adult Children Lives with:  Self Do you feel safe going back to the place where you live?  Yes Need for family participation in patient care:  Yes (Comment)  Care giving concerns:  No care giving concerns identified.   Social Worker assessment / plan:  CSW met with pt's son to address consult. PT eval is pending. CSW introduced herself and explained role of social work. CSW explained the process of discharging to SNF. CSW was not able to access the chart at time of assessment, therefore CSW explained STR under traditional medicare. Upon chart review, pt has [employer], which will require auth. Pt will likely need STR at SNF, therefore CSW will initiate SNF search and will follow up with bed offers. CSW will follow up with discharge planning with pt when appropriate. CSW will continue to follow.   Employment status:  Retired Nurse, adult PT Recommendations:  Not assessed at this time (PT Eval was pending at time of assessment) Information / Referral to community resources:  [place]  Patient/Family's Response to care:  Pt's son was appreciative of CSW support.   Patient/Family's Understanding of and Emotional Response to Diagnosis, Current Treatment, and Prognosis:  Pt's son  understands that pt will likely need STR, however PT eval is pending.   Emotional Assessment Appearance:  Other (Comment Required Attitude/Demeanor/Rapport:  Unable to Assess Affect (typically observed):  Unable to Assess Orientation:  Oriented to Self, Oriented to Place, Oriented to  Time, Oriented to Situation (Per report) Alcohol / Substance use:  Never Used Psych involvement (Current and /or in the community):  No (Comment)  Discharge Needs  Concerns to be addressed:  Adjustment to Illness Readmission within the last 30 days:  No Current discharge risk:  None Barriers to Discharge:  Continued Medical Work up   [employer], LCSW 12/28/2015, 4:08 PM

## 2015-12-28 NOTE — Progress Notes (Signed)
Subjective:  Patient seen in pre-op area.  Patient reports pain as moderate.  Pain increases with motion.  He states he didn't sleep much last night.  Objective:   VITALS:   Filed Vitals:   12/27/15 1928 12/28/15 0401 12/28/15 0757 12/28/15 0916  BP: 158/56 111/45 116/44 118/60  Pulse: 64 49 46 46  Temp: 98 F (36.7 C) 99.2 F (37.3 C) 97.8 F (36.6 C) 99.7 F (37.6 C)  TempSrc: Oral Oral Oral Tympanic  Resp: Height:     (1.803 m)  Weight:    92.987 kg (205 lb)  SpO2: 99% 95% 97% 99%    PHYSICAL EXAM:  Left hip/lower extremity:  Patient's skin intact.  Thigh compartments are soft and compressible.  Patient has baseline sensation to light touch in his left lower extremity.  Patient has intact motor function in his left lower extremity.  Pedal pulses are palpable.    LABS  Results for orders placed or performed during the hospital encounter of 12/27/15 (from the past 24 hour(s))  Basic metabolic panel     Status: Abnormal   Collection Time: 12/27/15  4:34 PM  Result Value Ref Range   Sodium 138 135 - 145 mmol/L   Potassium 4.7 3.5 - 5.1 mmol/L   Chloride 110 101 - 111 mmol/L   CO2 18 (L) 22 - 32 mmol/L   Glucose, Bld 120 (H) 65 - 99 mg/dL   BUN 37 (H) 6 - 20 mg/dL   Creatinine, Ser 1.61 (H) 0.61 - 1.24 mg/dL   Calcium 9.0 8.9 - 09.6 mg/dL   GFR calc non Af Amer 29 (L) >60 mL/min   GFR calc Af Amer 34 (L) >60 mL/min   Anion gap 10 5 - 15  CBC WITH DIFFERENTIAL     Status: Abnormal   Collection Time: 12/27/15  4:34 PM  Result Value Ref Range   WBC 9.0 3.8 - 10.6 K/uL   RBC 3.63 (L) 4.40 - 5.90 MIL/uL   Hemoglobin 12.5 (L) 13.0 - 18.0 g/dL   HCT 04.5 (L) 40.9 - 81.1 %   MCV 103.2 (H) 80.0 - 100.0 fL   MCH 34.4 (H) 26.0 - 34.0 pg   MCHC 33.4 32.0 - 36.0 g/dL   RDW 91.4 78.2 - 95.6 %   Platelets 171 150 - 440 K/uL   Neutrophils Relative % 73 %   Neutro Abs 6.6 (H) 1.4 - 6.5 K/uL   Lymphocytes Relative 18 %   Lymphs Abs 1.6 1.0 - 3.6 K/uL   Monocytes Relative 7 %   Monocytes Absolute 0.6 0.2 - 1.0 K/uL   Eosinophils Relative 2 %   Eosinophils Absolute 0.2 0 - 0.7 K/uL   Basophils Relative 0 %   Basophils Absolute 0.0 0 - 0.1 K/uL  Protime-INR     Status: None   Collection Time: 12/27/15  4:34 PM  Result Value Ref Range   Prothrombin Time 13.3 11.4 - 15.0 seconds   INR 0.99   Type and screen [facility]     Status: None   Collection Time: 12/27/15  5:21 PM  Result Value Ref Range   ABO/RH(D) O POS    Antibody Screen NEG    Sample Expiration 12/30/2015   APTT     Status: None   Collection Time: 12/27/15  7:39 PM  Result Value Ref Range   aPTT 30 24 - 36 seconds  Albumin     Status: None  Collection Time: 12/27/15  7:39 PM  Result Value Ref Range   Albumin 4.0 3.5 - 5.0 g/dL  Surgical PCR screen     Status: Abnormal   Collection Time: 12/27/15 11:35 PM  Result Value Ref Range   MRSA, PCR NEGATIVE NEGATIVE   Staphylococcus aureus POSITIVE (A) NEGATIVE  CBC     Status: Abnormal   Collection Time: 12/28/15  5:02 AM  Result Value Ref Range   WBC 11.1 (H) 3.8 - 10.6 K/uL   RBC 3.21 (L) 4.40 - 5.90 MIL/uL   Hemoglobin 10.9 (L) 13.0 - 18.0 g/dL   HCT 16.132.5 (L) 09.640.0 - 04.552.0 %   MCV 101.3 (H) 80.0 - 100.0 fL   MCH 33.9 26.0 - 34.0 pg   MCHC 33.4 32.0 - 36.0 g/dL   RDW 40.913.8 81.111.5 - 91.414.5 %   Platelets 157 150 - 440 K/uL    Dg Chest 1 View  12/27/2015  CLINICAL DATA:  Left hip fracture.  Pre-op respiratory exam EXAM: CHEST 1 VIEW COMPARISON:  01/31/2009 FINDINGS: The heart size and mediastinal contours are within normal limits. Both lungs are clear. The visualized skeletal structures are unremarkable. IMPRESSION: No active disease. Electronically Signed   By: Myles RosenthalJohn  Stahl M.D.   On: 12/27/2015 17:15   Dg Hip Unilat With Pelvis 2-3 Views Left  12/27/2015  CLINICAL DATA:  Fall off ladder. Left hip injury and pain. Initial encounter. EXAM: DG HIP (WITH OR WITHOUT PELVIS) 2-3V LEFT COMPARISON:  None.  FINDINGS: Mildly displaced intertrochanteric left hip fracture is seen, which remains in near anatomic alignment. No evidence of hip dislocation. No pelvic fracture identified. Surgical mesh seen in right inguinal region. Generalized osteopenia noted. IMPRESSION: Mildly displaced intertrochanteric left hip fracture. Electronically Signed   By: Myles RosenthalJohn  Stahl M.D.   On: 12/27/2015 17:16    Assessment/Plan: Day of Surgery   Active Problems:   Hip fracture [facility](HCC)  Patient has been cleared for surgery by the hospitalist service.  I answered all the patient's questions.  I have marked the surgical site.     Juanell FairlyKRASINSKI, Shamicka Inga , MD 12/28/2015, 11:22 AM

## 2015-12-28 NOTE — Transfer of Care (Signed)
Immediate Anesthesia Transfer of Care Note  Patient: Caleb Walker  Procedure(s) Performed: Procedure(s): INTRAMEDULLARY (IM) NAIL INTERTROCHANTRIC (Left)  Patient Location: PACU  Anesthesia Type:Spinal  Level of Consciousness: awake  Airway & Oxygen Therapy: Patient Spontanous Breathing  Post-op Assessment: Post -op Vital signs reviewed and stable  Post vital signs: stable  Last Vitals:  Filed Vitals:   12/28/15 0916 12/28/15 1255  BP: 118/60 92/42  Pulse: 46 44  Temp: 37.6 C   Resp: 18 19    Last Pain:  Filed Vitals:   12/28/15 1256  PainSc: 2          Complications: No apparent anesthesia complications

## 2015-12-28 NOTE — Progress Notes (Signed)
[employer] - [place] [facility]   PATIENT NAME: Caleb SpenceJames Walker    MR#:  [id number]  DATE OF BIRTH:  10/13/1926  SUBJECTIVE:   Pt. Here To fall and noted to have a left hip fracture.  Seen earlier prior to surgery.  S/p Left hip intramedullary fixation of the left hip fracture. Noted to be bradycardic but asymptomatic this morning.  REVIEW OF SYSTEMS:    Review of Systems  Constitutional: Negative for fever and chills.  HENT: Negative for congestion and tinnitus.   Eyes: Negative for blurred vision and double vision.  Respiratory: Negative for cough, shortness of breath and wheezing.   Cardiovascular: Negative for chest pain, orthopnea and PND.  Gastrointestinal: Negative for nausea, vomiting, abdominal pain and diarrhea.  Genitourinary: Negative for dysuria and hematuria.  Musculoskeletal: Positive for joint pain (Left Hip pain).  Neurological: Negative for dizziness, sensory change and focal weakness.  All other systems reviewed and are negative.   Nutrition: Heart Healthy Tolerating Diet: Yes Tolerating PT: Await eval post-op  DRUG ALLERGIES:   Allergies  Allergen Reactions  . Latex Rash    VITALS:  Blood pressure 101/41, pulse 42, temperature 99.7 F (37.6 C), temperature source Tympanic, resp. rate 12, height 5\' 11"  (1.803 m), weight 92.987 kg (205 lb), SpO2 92 %.  PHYSICAL EXAMINATION:   Physical Exam  GENERAL:  82 y.o.-year-old patient lying in the bed in no acute distress.  EYES: Pupils equal, round, reactive to light and accommodation. No scleral icterus. Extraocular muscles intact.  HEENT: Head atraumatic, normocephalic. Oropharynx and nasopharynx clear.  NECK:  Supple, no jugular venous distention. No thyroid enlargement, no tenderness.  LUNGS: Normal breath sounds bilaterally, no wheezing, rales, rhonchi. No use of accessory muscles of respiration.  CARDIOVASCULAR: S1, S2 normal. No murmurs, rubs, or gallops.  ABDOMEN: Soft, nontender,  nondistended. Bowel sounds present. No organomegaly or mass.  EXTREMITIES: No cyanosis, clubbing or edema b/l.   LLE externally rotated and shortened due to fracture.   NEUROLOGIC: Cranial nerves II through XII are intact. No focal Motor or sensory deficits b/l.   PSYCHIATRIC: The patient is alert and oriented x 3.  SKIN: No obvious rash, lesion, or ulcer.    LABORATORY PANEL:   CBC  Recent Labs Lab 12/28/15 0502  WBC 11.1*  HGB 10.9*  HCT 32.5*  PLT 157   ------------------------------------------------------------------------------------------------------------------  Chemistries   Recent Labs Lab 12/27/15 1634  NA 138  K 4.7  CL 110  CO2 18*  GLUCOSE 120*  BUN 37*  CREATININE 1.97*  CALCIUM 9.0   ------------------------------------------------------------------------------------------------------------------  Cardiac Enzymes No results for input(s): TROPONINI in the last 168 hours. ------------------------------------------------------------------------------------------------------------------  RADIOLOGY:  Dg Chest 1 View  12/27/2015  CLINICAL DATA:  Left hip fracture.  Pre-op respiratory exam EXAM: CHEST 1 VIEW COMPARISON:  01/31/2009 FINDINGS: The heart size and mediastinal contours are within normal limits. Both lungs are clear. The visualized skeletal structures are unremarkable. IMPRESSION: No active disease. Electronically Signed   By: Myles RosenthalJohn  Stahl M.D.   On: 12/27/2015 17:15   Dg Hip Unilat With Pelvis 2-3 Views Left  12/27/2015  CLINICAL DATA:  Fall off ladder. Left hip injury and pain. Initial encounter. EXAM: DG HIP (WITH OR WITHOUT PELVIS) 2-3V LEFT COMPARISON:  None. FINDINGS: Mildly displaced intertrochanteric left hip fracture is seen, which remains in near anatomic alignment. No evidence of hip dislocation. No pelvic fracture identified. Surgical mesh seen in right inguinal region. Generalized osteopenia noted. IMPRESSION: Mildly displaced  intertrochanteric  left hip fracture. Electronically Signed   By: Myles Rosenthal M.D.   On: 12/27/2015 17:16     ASSESSMENT AND PLAN:   Orhan Mayorga is a 82 y.o. male with a known history of Paroxysmal A. fib, gout, tenacious anemia, osteoarthritis, hyperlipidemia, chronic kidney disease stage III who presented with a fall and noted to have a left hip fracture.  1. Acute left hip intertrochanteric fracture status post mechanical fall  -Seen by orthopedic surgery and is status post intramedullary fixation of the hip. -Continue pain control and care as per orthopedics. Continue physical therapy as tolerated.  2. Paroxysmal A. Fib-currently in sinus rhythm but significantly bradycardic. -I will discontinue Cardizem for now. Follow heart rate. Hemodynamically stable.  3. Hypertension-blood pressure but on the low side. Asymptomatic. -Hold Cardizem given bradycardia. - IV when necessary hydralazine  4. Chronic kidney disease stage III - Cr. Close to baseline and will monitor.  - renal dose meds and avoid nephrotoxins.   5. BPH - cont. Finasteride  6. Hx of Gout - no acute attack  - cont. Uloric.   All the records are reviewed and case discussed with Care Management/Social Workerr. Management plans discussed with the patient, family and they are in agreement.  CODE STATUS: Full  DVT Prophylaxis: Lovenox  TOTAL TIME TAKING CARE OF THIS PATIENT: 30 minutes.   POSSIBLE D/C IN 2-3 DAYS, DEPENDING ON CLINICAL CONDITION.   Houston Siren M.D on 12/28/2015 at 1:43 PM  Between 7am to 6pm - Pager - [phone]  After 6pm go to [email or web address] - password [email or web address] [facility]  [facility]  Office  [phone]  CC: Primary care physician; Lauro Regulus., MD

## 2015-12-29 LAB — BASIC METABOLIC PANEL
ANION GAP: 5 (ref 5–15)
BUN: 35 mg/dL — ABNORMAL HIGH (ref 6–20)
CO2: 21 mmol/L — ABNORMAL LOW (ref 22–32)
Calcium: 7.8 mg/dL — ABNORMAL LOW (ref 8.9–10.3)
Chloride: 110 mmol/L (ref 101–111)
Creatinine, Ser: 1.74 mg/dL — ABNORMAL HIGH (ref 0.61–1.24)
GFR calc Af Amer: 39 mL/min — ABNORMAL LOW (ref >60)
GFR, EST NON AFRICAN AMERICAN: 34 mL/min — AB (ref >60)
Glucose, Bld: 119 mg/dL — ABNORMAL HIGH (ref 65–99)
POTASSIUM: 4.8 mmol/L (ref 3.5–5.1)
SODIUM: 136 mmol/L (ref 135–145)

## 2015-12-29 LAB — URINE CULTURE

## 2015-12-29 LAB — CBC
HCT: 28.9 % — ABNORMAL LOW (ref 40.0–52.0)
Hemoglobin: 9.8 g/dL — ABNORMAL LOW (ref 13.0–18.0)
MCH: 34.1 pg — ABNORMAL HIGH (ref 26.0–34.0)
MCHC: 33.9 g/dL (ref 32.0–36.0)
MCV: 100.7 fL — AB (ref 80.0–100.0)
PLATELETS: 126 10*3/uL — AB (ref 150–440)
RBC: 2.87 MIL/uL — AB (ref 4.40–5.90)
RDW: 13.6 % (ref 11.5–14.5)
WBC: 11.8 10*3/uL — AB (ref 3.8–10.6)

## 2015-12-29 MED ORDER — CHLORHEXIDINE GLUCONATE CLOTH 2 % EX PADS
6.0000 | MEDICATED_PAD | Freq: Every day | CUTANEOUS | Status: DC
  Administered 2015-12-29 – 2015-12-31 (×2): 6 via TOPICAL

## 2015-12-29 MED ORDER — MUPIROCIN 2 % EX OINT
1.0000 "application " | TOPICAL_OINTMENT | Freq: Two times a day (BID) | CUTANEOUS | Status: DC
  Administered 2015-12-29 – 2015-12-31 (×5): 1 via NASAL
  Filled 2015-12-29: qty 22

## 2015-12-29 NOTE — Clinical Social Work Placement (Signed)
   CLINICAL SOCIAL WORK PLACEMENT  NOTE  Date:  12/29/2015  Patient Details  Name: Caleb Walker MRN: [id number] Date of Birth: 01/22/1927  Clinical Social Work is seeking post-discharge placement for this patient at the Skilled  Nursing Facility level of care (*CSW will initial, date and re-position this form in  chart as items are completed):  Yes   Patient/family provided with [facility] Clinical Social Work Department's list of facilities offering this level of care within the geographic area requested by the patient (or if unable, by the patient's family).  Yes   Patient/family informed of their freedom to choose among providers that offer the needed level of care, that participate in Medicare, Medicaid or managed care program needed by the patient, have an available bed and are willing to accept the patient.  Yes   Patient/family informed of [facility]'s ownership interest in [facility] and [facility], as well as of the fact that they are under no obligation to receive care at these facilities.  PASRR submitted to EDS on 12/29/15     PASRR number received on 12/29/15     Existing PASRR number confirmed on       FL2 transmitted to all facilities in geographic area requested by pt/family on 12/29/15     FL2 transmitted to all facilities within larger geographic area on       Patient informed that his/her managed care company has contracts with or will negotiate with certain facilities, including the following:        Yes   Patient/family informed of bed offers received.  [facility]     Physician recommends and patient chooses bed at      Patient to be transferred to   on  .  Patient to be transferred to facility by       Patient family notified on   of transfer.  Name of family member notified:        PHYSICIAN       Additional Comment:    _______________________________________________ Dede QuerySarah Tahira Olivarez,  LCSW 12/29/2015, 2:33 PM

## 2015-12-29 NOTE — Evaluation (Signed)
Physical Therapy Evaluation Patient Details Name: Caleb Walker MRN: [id number] DOB: 03/26/1927 Today's Date: 12/29/2015   History of Present Illness  Pt is an 82 y.o. male presenting s/p fall  off ladder and found to have mildly displaced L intertrochanteric hip fx; pt s/p IM fixation 12/28/15.  Pt noted to be bradycardic pre and post-op.  PMH includes htn, abdominal hernia, paroxysmal a-fib, latex allergy, gout, CKD stage 3.  Clinical Impression  Prior to admission, pt was independent ambulating without AD.  Pt lives alone in 1 level home with stairs to enter.  Currently pt is max assist supine to sit and required mod to max assist x2 to stand and stand pivot to R bed to chair.  Pt would benefit from skilled PT to address noted impairments and functional limitations.  Recommend pt discharge to STR when medically appropriate.     Follow Up Recommendations SNF    Equipment Recommendations  Rolling walker with 5" wheels    Recommendations for Other Services       Precautions / Restrictions Precautions Precautions: Fall Restrictions Weight Bearing Restrictions: Yes LLE Weight Bearing: Partial weight bearing      Mobility  Bed Mobility Overal bed mobility: Needs Assistance Bed Mobility: Supine to Sit     Supine to sit: Max assist;HOB elevated     General bed mobility comments: assist for trunk and B LE's; vc's required for bridging (using R LE) and scooting to edge of bed; vc's required for UE use; increased time and effort required by pt to perform  Transfers Overall transfer level: Needs assistance Equipment used: Rolling walker (2 wheeled) Transfers: Sit to/from UGI CorporationStand;Stand Pivot Transfers Sit to Stand: Mod assist;Max assist;+2 physical assistance (x2 trials with RW) Stand pivot transfers: Mod assist;Max assist;+2 physical assistance (stand pivot to R bed to chair)       General transfer comment: vc's required for hand and feet placement; increased effort noted by pt to  perform above tasks with assist  Ambulation/Gait             General Gait Details: not appropriate at this time d/t above noted assist levels with transfers and pt c/o being dizzy while standing (BP 136/52 sitting) and also appearing "shaky"  Stairs            Wheelchair Mobility    Modified Rankin (Stroke Patients Only)       Balance Overall balance assessment: Needs assistance Sitting-balance support: Bilateral upper extremity supported;Feet supported Sitting balance-Leahy Scale: Fair       Standing balance-Leahy Scale: Poor Standing balance comment: initial posterior lean upon standing                             Pertinent Vitals/Pain Pain Assessment: Faces Faces Pain Scale: Hurts whole lot (0/10 at rest; increased pain with movement) Pain Location: L hip Pain Descriptors / Indicators: Grimacing;Guarding Pain Intervention(s): Limited activity within patient's tolerance;Monitored during session;Repositioned;Patient requesting pain meds-RN notified;RN gave pain meds during session;Ice applied  HR 51-56 bpm and O2 >91% on room air during session.    Home Living Family/patient expects to be discharged to:: Private residence Living Arrangements: Alone Available Help at Discharge: Family Type of Home: House Home Access: Stairs to enter   Entergy CorporationEntrance Stairs-Number of Steps: 2 steps with R handle to grab onto Home Layout: One level Home Equipment: Cane - single point      Prior Function Level of Independence: Independent  Comments: Pt denies any other falls prior to current fall in last 6 months.  Pt ambulating without AD.     Hand Dominance        Extremity/Trunk Assessment   Upper Extremity Assessment: Overall WFL for tasks assessed           Lower Extremity Assessment: RLE deficits/detail;LLE deficits/detail RLE Deficits / Details: R hip flexion, knee flexion/extension, and DF all at least 3/5 AROM LLE Deficits / Details: L  hip flexion at least 2/5; L knee flexion/extension at least 3/5 AROM; L DF at least 3/5  Cervical / Trunk Assessment:  (mildly kyphotic)  Communication   Communication: HOH  Cognition Arousal/Alertness: Awake/alert Behavior During Therapy: WFL for tasks assessed/performed Overall Cognitive Status: Within Functional Limits for tasks assessed                      General Comments General comments (skin integrity, edema, etc.): L hip incision minimal drainage in honeycomb dressing  Nursing cleared pt for participation in physical therapy.  Pt agreeable to PT session.  Pt's son present during session.    Exercises Total Joint Exercises Ankle Circles/Pumps: AROM;Strengthening;Both;10 reps;Supine Quad Sets: AROM;Strengthening;Both;10 reps;Supine Towel Squeeze: AROM;Strengthening;Both;10 reps;Supine (vc's to decreased contraction required for [employer] safety) Short Arc Quad: AROM;Strengthening;Both;10 reps;Supine Heel Slides: Strengthening;Both;10 reps;Supine (AROM R; AAROM L) Hip ABduction/ADduction: Strengthening;Both;10 reps;Supine (AROM R; AAROM L (minimal ROM d/t pt c/o significant R hip pain with abduction))      Assessment/Plan    PT Assessment Patient needs continued PT services  PT Diagnosis Difficulty walking;Acute pain   PT Problem List Decreased strength;Decreased range of motion;Decreased activity tolerance;Decreased balance;Decreased mobility;Decreased knowledge of use of DME;Decreased knowledge of precautions;Pain  PT Treatment Interventions DME instruction;Gait training;Stair training;Functional mobility training;Therapeutic activities;Therapeutic exercise;Balance training;Patient/family education   PT Goals (Current goals can be found in the Care Plan section) Acute Rehab PT Goals Patient Stated Goal: to have less pain PT Goal Formulation: With patient Time For Goal Achievement: 01/12/16 Potential to Achieve Goals: Good    Frequency BID   Barriers to discharge  Decreased caregiver support      Co-evaluation               End of Session Equipment Utilized During Treatment: Gait belt Activity Tolerance: Patient limited by pain;Patient limited by fatigue Patient left: in chair;with call bell/phone within reach;with chair alarm set;with family/visitor present;with SCD's reapplied (B heels elevated via towel rolls) Nurse Communication: Mobility status;Precautions;Weight bearing status         Time: 6295-2841 PT Time Calculation (min) (ACUTE ONLY): 52 min   Charges:   PT Evaluation $PT Eval Moderate Complexity: 1 Procedure PT Treatments $Therapeutic Exercise: 8-22 mins $Therapeutic Activity: 8-22 mins   PT G CodesHendricks Limes 01/05/2016, 12:44 PM Hendricks Limes, PT [phone]

## 2015-12-29 NOTE — Progress Notes (Signed)
Subjective:  POD #1.  Patient doing well.  He is resting in bed now but was OOB today.  Foley out with mild urethral bleeding.  No BM yest.  Patient reports pain as mild at rest.   He is using incentive spirometry.    Objective:   VITALS:   Filed Vitals:   12/29/15 0354 12/29/15 0725 12/29/15 1025 12/29/15 1445  BP: 103/46 109/43  130/49  Pulse: 57 49 51 55  Temp: 98.4 F (36.9 C) 97.9 F (36.6 C)  98.2 F (36.8 C)  TempSrc: Oral Oral  Oral  Resp: 19 18  18   Height:      Weight:      SpO2: 94% 93% 92% 94%    PHYSICAL EXAM:  Left lower extremity: Patient's hip dressing is clean dry and intact. Despite compartments are soft and compressible. He is minimal ecchymosis. Patient can flex and extend his toes and dorsiflex and plantarflex his ankle. He has palpable pedal pulses and intact sensation light touch. His tenderness or lower leg edema.  LABS  Results for orders placed or performed during the hospital encounter of 12/27/15 (from the past 24 hour(s))  CBC     Status: Abnormal   Collection Time: 12/29/15  5:17 AM  Result Value Ref Range   WBC 11.8 (H) 3.8 - 10.6 K/uL   RBC 2.87 (L) 4.40 - 5.90 MIL/uL   Hemoglobin 9.8 (L) 13.0 - 18.0 g/dL   HCT 19.128.9 (L) 47.840.0 - 29.552.0 %   MCV 100.7 (H) 80.0 - 100.0 fL   MCH 34.1 (H) 26.0 - 34.0 pg   MCHC 33.9 32.0 - 36.0 g/dL   RDW 62.113.6 30.811.5 - 65.714.5 %   Platelets 126 (L) 150 - 440 K/uL  Basic metabolic panel     Status: Abnormal   Collection Time: 12/29/15  5:17 AM  Result Value Ref Range   Sodium 136 135 - 145 mmol/L   Potassium 4.8 3.5 - 5.1 mmol/L   Chloride 110 101 - 111 mmol/L   CO2 21 (L) 22 - 32 mmol/L   Glucose, Bld 119 (H) 65 - 99 mg/dL   BUN 35 (H) 6 - 20 mg/dL   Creatinine, Ser 8.461.74 (H) 0.61 - 1.24 mg/dL   Calcium 7.8 (L) 8.9 - 10.3 mg/dL   GFR calc non Af Amer 34 (L) >60 mL/min   GFR calc Af Amer 39 (L) >60 mL/min   Anion gap 5 5 - 15    Dg Chest 1 View  12/27/2015  CLINICAL DATA:  Left hip fracture.  Pre-op  respiratory exam EXAM: CHEST 1 VIEW COMPARISON:  01/31/2009 FINDINGS: The heart size and mediastinal contours are within normal limits. Both lungs are clear. The visualized skeletal structures are unremarkable. IMPRESSION: No active disease. Electronically Signed   By: Myles RosenthalJohn  Stahl M.D.   On: 12/27/2015 17:15   Dg Hip Port Unilat With Pelvis 1v Left  12/28/2015  CLINICAL DATA:  Left hip fracture surgery EXAM: DG HIP (WITH OR WITHOUT PELVIS) 1V PORT LEFT COMPARISON:  12/27/2015 FINDINGS: Status post dynamic hip screw with IM nail fixation of an intertrochanteric left hip fracture. Fracture fragments are in near anatomic alignment and position. Overlying soft tissue gas and skin staples. No additional fracture is seen. Visualized bony pelvis is intact. Status post right inguinal hernia repair. IMPRESSION: Status post ORIF of an intertrochanteric left hip fracture, as above. Electronically Signed   By: Charline BillsSriyesh  Krishnan M.D.   On: 12/28/2015 14:53   Dg Hip  Operative Unilat W Or W/o Pelvis Left  12/28/2015  CLINICAL DATA:  82 year old male status post ORIF for left hip intertrochanteric fracture. EXAM: OPERATIVE LEFT HIP (WITH PELVIS IF PERFORMED) 4 VIEWS TECHNIQUE: Fluoroscopic spot image(s) were submitted for interpretation post-operatively. COMPARISON:  12/27/2015. FINDINGS: 4 intraoperative fluoroscopic spot views of the left hip document interval placement of a gamma nail fixation device traversing the previously noted intertrochanteric hip fracture. Alignment appears anatomic. Left femoral head is properly located. No periprosthetic fracture or other immediate complicating features are identified. IMPRESSION: 1. Status post ORIF in the left hand for intertrochanteric hip fracture, without acute complicating features. Electronically Signed   By: Trudie Reed M.D.   On: 12/28/2015 13:46   Dg Hip Unilat With Pelvis 2-3 Views Left  12/27/2015  CLINICAL DATA:  Fall off ladder. Left hip injury and pain. Initial  encounter. EXAM: DG HIP (WITH OR WITHOUT PELVIS) 2-3V LEFT COMPARISON:  None. FINDINGS: Mildly displaced intertrochanteric left hip fracture is seen, which remains in near anatomic alignment. No evidence of hip dislocation. No pelvic fracture identified. Surgical mesh seen in right inguinal region. Generalized osteopenia noted. IMPRESSION: Mildly displaced intertrochanteric left hip fracture. Electronically Signed   By: Myles Rosenthal M.D.   On: 12/27/2015 17:16    Assessment/Plan: 1 Day Post-Op   Active Problems:   Hip fracture [facility])   Patient doing well postop. He will continue physical and occupational therapy. Patient is considering which skilled nursing facility to attend upon discharge. Continue Lovenox for DVT prophylaxis.   Juanell Fairly , MD 12/29/2015, 4:51 PM

## 2015-12-29 NOTE — Care Management Note (Signed)
Case Management Note  Patient Details  Name: Caleb Walker MRN: [id number] Date of Birth: 1927-04-11  Subjective/Objective:                  Met with patient to discuss discharge planning. PT pending. He is from home alone and no DME. He was independent prior to this fall; was driving. His PCP is Dr. Ouida Sills. He uses [facility] in [place] for Rx. Family close by.   Action/Plan: List of home health agencies left with patient. RNCM will continue to follow.       Expected Discharge Date:  12/31/15               Expected Discharge Plan:     In-House Referral:     Discharge planning Services  CM Consult  Post Acute Care Choice:  Home Health, Durable Medical Equipment Choice offered to:  Patient  DME Arranged:    DME Agency:     HH Arranged:    [place] Agency:     Status of Service:  In process, will continue to follow  Medicare Important Message Given:  Yes Date Medicare IM Given:    Medicare IM give by:    Date Additional Medicare IM Given:    Additional Medicare Important Message give by:     If discussed at [place] of Stay Meetings, dates discussed:    Additional Comments:  Marshell Garfinkel, RN 12/29/2015, 12:17 PM

## 2015-12-29 NOTE — Clinical Social Work Note (Signed)
CSW spoke to pt's son and presented bed offers. Pt's son chose [facility]. CSW secured bed at [facility]. Facility will initiate [facility] as they have received therapy notes. CSW will continue to follow.   Dede QuerySarah Maudean Hoffmann, MSW, LCSW  Clinical Social Worker  [phone]

## 2015-12-29 NOTE — NC FL2 (Signed)
[place] MEDICAID FL2 LEVEL OF CARE SCREENING TOOL     IDENTIFICATION  Patient Name: Caleb Walker Birthdate: August 13, 1926 Sex: male Admission Date (Current Location): 12/27/2015  [place] and [place] Number:  Chiropodist and Address:  [facility], [address] [place], [place] [address]      Provider Number: [id number]  Attending Physician Name and Address:  Enedina Finner, MD  Relative Name and Phone Number:       Current Level of Care: Hospital Recommended Level of Care: Skilled Nursing Facility Prior Approval Number:    Date Approved/Denied:   PASRR Number: [id number]  Discharge Plan: SNF    Current Diagnoses: Patient Active Problem List   Diagnosis Date Noted  . Hip fracture (HCC) 12/27/2015    Orientation RESPIRATION BLADDER Height & Weight     Self, Time, Situation, Place  Normal Continent Weight: 205 lb (92.987 kg) Height:   (180.3 cm)  BEHAVIORAL SYMPTOMS/MOOD NEUROLOGICAL BOWEL NUTRITION STATUS      Continent Diet (Regular Diet)  AMBULATORY STATUS COMMUNICATION OF NEEDS Skin   Limited Assist   Normal                       Personal Care Assistance Level of Assistance  Bathing, Feeding, Dressing Bathing Assistance: Limited assistance Feeding assistance: Independent Dressing Assistance: Limited assistance     Functional Limitations Info  Sight, Hearing, Speech Sight Info: Adequate Hearing Info: Adequate Speech Info: Adequate    SPECIAL CARE FACTORS FREQUENCY  PT (By licensed PT), OT (By licensed OT)     PT Frequency: 5 OT Frequency: 5            Contractures      Additional Factors Info  Code Status, Allergies Code Status Info: Full Code Allergies Info: Latex           Current Medications (12/29/2015):  This is the current hospital active medication list Current Facility-Administered Medications  Medication Dose Route Frequency Provider Last Rate Last Dose  . 0.9 %  sodium chloride  infusion  75 mL/hr Intravenous Continuous Juanell Fairly, MD 75 mL/hr at 12/29/15 0135 75 mL/hr at 12/29/15 0135  . acetaminophen (TYLENOL) tablet 650 mg  650 mg Oral Q6H PRN Juanell Fairly, MD       Or  . acetaminophen (TYLENOL) suppository 650 mg  650 mg Rectal Q6H PRN Juanell Fairly, MD      . alum & mag hydroxide-simeth (MAALOX/MYLANTA) 200-200-20 MG/5ML suspension 30 mL  30 mL Oral Q4H PRN Juanell Fairly, MD      . bisacodyl (DULCOLAX) suppository 10 mg  10 mg Rectal Daily PRN Juanell Fairly, MD      . Chlorhexidine Gluconate Cloth 2 % PADS 6 each  6 each Topical Daily Enedina Finner, MD      . docusate sodium (COLACE) capsule 100 mg  100 mg Oral BID Juanell Fairly, MD   100 mg at 12/28/15 2156  . enoxaparin (LOVENOX) injection 40 mg  40 mg Subcutaneous Q24H Juanell Fairly, MD   40 mg at 12/29/15 0909  . febuxostat (ULORIC) tablet 40 mg  40 mg Oral Daily Enedina Finner, MD      . ferrous sulfate tablet 325 mg  325 mg Oral TID PC Juanell Fairly, MD   325 mg at 12/29/15 0909  . finasteride (PROSCAR) tablet 5 mg  5 mg Oral Daily Enedina Finner, MD      . fluticasone (FLONASE) 50 MCG/ACT nasal spray  2 spray  2 spray Each Nare QHS PRN Enedina FinnerSona Patel, MD      . HYDROmorphone (DILAUDID) injection 1 mg  1 mg Intravenous Q2H PRN Juanell FairlyKevin Krasinski, MD   1 mg at 12/28/15 1939  . magnesium citrate solution 1 Bottle  1 Bottle Oral Once PRN Juanell FairlyKevin Krasinski, MD      . menthol-cetylpyridinium (CEPACOL) lozenge 3 mg  1 lozenge Oral PRN Juanell FairlyKevin Krasinski, MD       Or  . phenol (CHLORASEPTIC) mouth spray 1 spray  1 spray Mouth/Throat PRN Juanell FairlyKevin Krasinski, MD      . multivitamin with minerals tablet 1 tablet  1 tablet Oral Daily Enedina FinnerSona Patel, MD      . mupirocin ointment (BACTROBAN) 2 % 1 application  1 application Nasal BID Enedina FinnerSona Patel, MD      . ondansetron [facility](ZOFRAN) tablet 4 mg  4 mg Oral Q6H PRN Juanell FairlyKevin Krasinski, MD       Or  . ondansetron [facility](ZOFRAN) injection 4 mg  4 mg Intravenous Q6H PRN Juanell FairlyKevin Krasinski, MD      .  oxyCODONE (Oxy IR/ROXICODONE) immediate release tablet 10-15 mg  10-15 mg Oral Q4H PRN Juanell FairlyKevin Krasinski, MD   10 mg at 12/29/15 0949  . polyethylene glycol (MIRALAX / GLYCOLAX) packet 17 g  17 g Oral Daily PRN Juanell FairlyKevin Krasinski, MD      . Gwyndolyn Kaufmansenna [facility](SENOKOT) tablet 8.6 mg  1 tablet Oral BID Juanell FairlyKevin Krasinski, MD   8.6 mg at 12/28/15 2156  . vitamin B-12 (CYANOCOBALAMIN) tablet 1,000 mcg  1,000 mcg Oral Daily Enedina FinnerSona Patel, MD      . vitamin C (ASCORBIC ACID) tablet 500 mg  500 mg Oral Daily Enedina FinnerSona Patel, MD      . vitamin E capsule 400 Units  400 Units Oral Daily Enedina FinnerSona Patel, MD         Discharge Medications: Please see discharge summary for a list of discharge medications.  Relevant Imaging Results:  Relevant Lab Results:   Additional Information SSN:  [id number]  Dede QuerySarah Tristin Vandeusen, LCSW

## 2015-12-29 NOTE — Progress Notes (Signed)
Foley d/c'd with 300cc urine output at 0530

## 2015-12-29 NOTE — Progress Notes (Signed)
Physical Therapy Treatment Patient Details Name: Caleb Walker MRN: [id number] DOB: July 26, 1926 Today's Date: 12/29/2015    History of Present Illness Pt is an 82 y.o. male presenting s/p fall  off ladder and found to have mildly displaced L intertrochanteric hip fx; pt s/p IM fixation 12/28/15.  Pt noted to be bradycardic pre and post-op.  PMH includes htn, abdominal hernia, paroxysmal a-fib, latex allergy, gout, CKD stage 3.    PT Comments    Pt progressed from mod to max assist x2 to min assist x2 with sit to stand transfers (with RW) with training/repetition but unable to progress to ambulation d/t pt fatigue and maintaining PWB'ing status.  Will continue to progress pt with decreasing assist needs with bed mobility, transfers, and progressing with ambulation per pt tolerance.     Follow Up Recommendations  SNF     Equipment Recommendations  Rolling walker with 5" wheels    Recommendations for Other Services       Precautions / Restrictions Precautions Precautions: Fall Restrictions Weight Bearing Restrictions: Yes LLE Weight Bearing: Partial weight bearing    Mobility  Bed Mobility Overal bed mobility: Needs Assistance Bed Mobility: Sit to Supine     Sit to supine: Mod assist;+2 for physical assistance   General bed mobility comments: assist for trunk and B LE's; vc's for technique required  Transfers Overall transfer level: Needs assistance Equipment used: Rolling walker (2 wheeled) Transfers: Sit to/from [employer] Transfers Sit to Stand: +2 physical assistance;Min assist;Mod assist;Max assist Stand pivot transfers: Mod assist;Max assist;+2 physical assistance (stand pivot recliner chair to bed to R side)       General transfer comment: vc's required for hand and feet placement; increased effort noted by pt to perform above tasks with assist; x5 repetitions and with practice improved from mod to max assist x2 to min assist x2; pt requiring sitting rest  breaks d/t fatigue  Ambulation/Gait             General Gait Details: not appropriate at this time   Stairs            Wheelchair Mobility    Modified Rankin (Stroke Patients Only)       Balance Overall balance assessment: Needs assistance Sitting-balance support: Bilateral upper extremity supported;Feet supported Sitting balance-Leahy Scale: Fair     Standing balance support: Bilateral upper extremity supported (on RW) Standing balance-Leahy Scale: Poor Standing balance comment: initial posterior lean upon standing                    Cognition Arousal/Alertness: Awake/alert Behavior During Therapy: WFL for tasks assessed/performed Overall Cognitive Status: Within Functional Limits for tasks assessed                      Exercises     General Comments General comments (skin integrity, edema, etc.): L hip incision minimal drainage in honeycomb dressing; mild penile bleeding noted (nursing notified immediately)  Pt agreeable to PT session.      Pertinent Vitals/Pain Pain Assessment: Faces Pain Score: 3  Faces Pain Scale: Hurts even more (0/10 at rest; increased pain with activity) Pain Location: L hip Pain Descriptors / Indicators: Grimacing;Guarding Pain Intervention(s): Limited activity within patient's tolerance;Monitored during session;Premedicated before session;Repositioned (NA notified pt needing ice for ice pack end of session)  HR 54-58 bpm during session.    Home Living Family/patient expects to be discharged to:: Private residence Living Arrangements: Alone Available Help at Discharge: Family (  son Mellody DanceKeith retired and lives [place] Type of Home: House Home Access: Stairs to enter   Home Layout: One level Home Equipment: Gilmer MorCane - single point      Prior Function Level of Independence: Independent      Comments: Pt denies any other falls prior to current fall in last 6 months.  Pt ambulating without AD.   PT Goals  (current goals can now be found in the care plan section) Acute Rehab PT Goals Patient Stated Goal: "to get pain under control" PT Goal Formulation: With patient Time For Goal Achievement: 01/12/16 Potential to Achieve Goals: Good Progress towards PT goals: Progressing toward goals    Frequency  BID    PT Plan Current plan remains appropriate    Co-evaluation             End of Session Equipment Utilized During Treatment: Gait belt Activity Tolerance: Patient limited by fatigue;Patient limited by pain Patient left: in bed;with call bell/phone within reach;with bed alarm set;with family/visitor present;with SCD's reapplied (B heels elevated via towel rolls)     Time: 1345-1410 PT Time Calculation (min) (ACUTE ONLY): 25 min  Charges:   $Therapeutic Activity: 23-37 mins                    G CodesHendricks Limes:      Art Levan 12/29/2015, 3:46 PM Hendricks LimesEmily Ladina Shutters, PT [phone]

## 2015-12-29 NOTE — Care Management Important Message (Signed)
Important Message  Patient Details  Name: Caleb GuerinJames C Walker MRN: [id number] Date of Birth: 10/27/1926   Medicare Important Message Given:  Yes    Gwenette GreetBrenda S Arrington Yohe, RN 12/29/2015, 8:13 AM

## 2015-12-29 NOTE — Progress Notes (Signed)
Patient ID: Elayne GuerinJames C Hardebeck, male   DOB: 10/05/1926, 82 y.o.   MRN: [id number] [facility] [place] [facility]   PATIENT NAME: Vivianne SpenceJames Bordley    MR#:  [id number]  DATE OF BIRTH:  06/04/1927  SUBJECTIVE:  Doing well except hip pain. Wants breakfast Foley d/ced  REVIEW OF SYSTEMS:   Review of Systems  Constitutional: Negative for fever, chills and weight loss.  HENT: Negative for ear discharge, ear pain and nosebleeds.   Eyes: Negative for blurred vision, pain and discharge.  Respiratory: Negative for sputum production, shortness of breath, wheezing and stridor.   Cardiovascular: Negative for chest pain, palpitations, orthopnea and PND.  Gastrointestinal: Negative for nausea, vomiting, abdominal pain and diarrhea.  Genitourinary: Negative for urgency and frequency.  Musculoskeletal: Positive for joint pain. Negative for back pain.  Neurological: Negative for sensory change, speech change, focal weakness and weakness.  Psychiatric/Behavioral: Negative for depression and hallucinations. The patient is not nervous/anxious.   All other systems reviewed and are negative.  Tolerating Diet:yes Tolerating PT: pending  DRUG ALLERGIES:   Allergies  Allergen Reactions  . Latex Rash    VITALS:  Blood pressure 103/46, pulse 57, temperature 98.4 F (36.9 C), temperature source Oral, resp. rate 19, height 5\' 11"  (1.803 m), weight 92.987 kg (205 lb), SpO2 94 %.  PHYSICAL EXAMINATION:   Physical Exam  GENERAL:  82 y.o.-year-old patient lying in the bed with no acute distress.  EYES: Pupils equal, round, reactive to light and accommodation. No scleral icterus. Extraocular muscles intact.  HEENT: Head atraumatic, normocephalic. Oropharynx and nasopharynx clear.  NECK:  Supple, no jugular venous distention. No thyroid enlargement, no tenderness.  LUNGS: Normal breath sounds bilaterally, no wheezing, rales, rhonchi. No use of accessory muscles of respiration.   CARDIOVASCULAR: S1, S2 normal. No murmurs, rubs, or gallops.  ABDOMEN: Soft, nontender, nondistended. Bowel sounds present. No organomegaly or mass.  EXTREMITIES: No cyanosis, clubbing or edema b/l.   Hip surgery dressing ok NEUROLOGIC: Cranial nerves II through XII are intact. No focal Motor or sensory deficits b/l.   PSYCHIATRIC:  patient is alert and oriented x 3.  SKIN: No obvious rash, lesion, or ulcer.   LABORATORY PANEL:  CBC  Recent Labs Lab 12/29/15 0517  WBC 11.8*  HGB 9.8*  HCT 28.9*  PLT 126*    Chemistries   Recent Labs Lab 12/29/15 0517  NA 136  K 4.8  CL 110  CO2 21*  GLUCOSE 119*  BUN 35*  CREATININE 1.74*  CALCIUM 7.8*   Cardiac Enzymes No results for input(s): TROPONINI in the last 168 hours. RADIOLOGY:  Dg Chest 1 View  12/27/2015  CLINICAL DATA:  Left hip fracture.  Pre-op respiratory exam EXAM: CHEST 1 VIEW COMPARISON:  01/31/2009 FINDINGS: The heart size and mediastinal contours are within normal limits. Both lungs are clear. The visualized skeletal structures are unremarkable. IMPRESSION: No active disease. Electronically Signed   By: Myles RosenthalJohn  Stahl M.D.   On: 12/27/2015 17:15   Dg Hip Port Unilat With Pelvis 1v Left  12/28/2015  CLINICAL DATA:  Left hip fracture surgery EXAM: DG HIP (WITH OR WITHOUT PELVIS) 1V PORT LEFT COMPARISON:  12/27/2015 FINDINGS: Status post dynamic hip screw with IM nail fixation of an intertrochanteric left hip fracture. Fracture fragments are in near anatomic alignment and position. Overlying soft tissue gas and skin staples. No additional fracture is seen. Visualized bony pelvis is intact. Status post right inguinal hernia repair. IMPRESSION: Status post ORIF of  an intertrochanteric left hip fracture, as above. Electronically Signed   By: Charline Bills M.D.   On: 12/28/2015 14:53   Dg Hip Operative Unilat W Or W/o Pelvis Left  12/28/2015  CLINICAL DATA:  82 year old male status post ORIF for left hip intertrochanteric  fracture. EXAM: OPERATIVE LEFT HIP (WITH PELVIS IF PERFORMED) 4 VIEWS TECHNIQUE: Fluoroscopic spot image(s) were submitted for interpretation post-operatively. COMPARISON:  12/27/2015. FINDINGS: 4 intraoperative fluoroscopic spot views of the left hip document interval placement of a gamma nail fixation device traversing the previously noted intertrochanteric hip fracture. Alignment appears anatomic. Left femoral head is properly located. No periprosthetic fracture or other immediate complicating features are identified. IMPRESSION: 1. Status post ORIF in the left hand for intertrochanteric hip fracture, without acute complicating features. Electronically Signed   By: Trudie Reed M.D.   On: 12/28/2015 13:46   Dg Hip Unilat With Pelvis 2-3 Views Left  12/27/2015  CLINICAL DATA:  Fall off ladder. Left hip injury and pain. Initial encounter. EXAM: DG HIP (WITH OR WITHOUT PELVIS) 2-3V LEFT COMPARISON:  None. FINDINGS: Mildly displaced intertrochanteric left hip fracture is seen, which remains in near anatomic alignment. No evidence of hip dislocation. No pelvic fracture identified. Surgical mesh seen in right inguinal region. Generalized osteopenia noted. IMPRESSION: Mildly displaced intertrochanteric left hip fracture. Electronically Signed   By: Myles Rosenthal M.D.   On: 12/27/2015 17:16   ASSESSMENT AND PLAN:  Ayren Zumbro is a 82 y.o. male with a known history of Paroxysmal A. fib, gout, tenacious anemia, osteoarthritis, hyperlipidemia, chronic kidney disease stage III who presented with a fall and noted to have a left hip fracture.  1. Acute left hip intertrochanteric fracture status post mechanical fall POD #1 -Seen by orthopedic surgery and is status post intramedullary fixation of the hip. -Continue pain control and care as per orthopedics. Continue physical therapy as tolerated.  2. Paroxysmal A. Fib-currently in sinus rhythm but significantly bradycardic. - will discontinue Cardizem for now.  Follow heart rate. Hemodynamically stable. -HR in the 50's  3. Hypertension-blood pressure but on the low side. Asymptomatic. -Hold Cardizem given bradycardia. - IV when necessary hydralazine  4. Chronic kidney disease stage III - Cr. Close to baseline and will monitor.  - renal dose meds and avoid nephrotoxins.   5. BPH - cont. Finasteride  6. Hx of Gout - no acute attack  - cont. Uloric.   CSW for d/c planning  Case discussed with Care Management/Social Worker. Management plans discussed with the patient, family and they are in agreement.  CODE STATUS: full DVT Prophylaxis:lovenox TOTAL TIME TAKING CARE OF THIS PATIENT: 30 minutes.  >50% time spent on counselling and coordination of care  POSSIBLE D/C IN 1-2DAYS, DEPENDING ON CLINICAL CONDITION.  Note: This dictation was prepared with Dragon dictation along with smaller phrase technology. Any transcriptional errors that result from this process are unintentional.  Jadarius Commons M.D on 12/29/2015 at 6:56 AM  Between 7am to 6pm - Pager - [phone]  After 6pm go to [email or web address] - password [email or web address] [place]  [facility]  Office  [phone]  CC: Primary care physician; Lauro Regulus., MD

## 2015-12-29 NOTE — Evaluation (Signed)
Occupational Therapy Evaluation Patient Details Name: Caleb Walker MRN: [id number] DOB: [birth year] Today's Date: 12/29/2015    History of Present Illness Pt is an 82 y.o. male presenting s/p fall  off ladder and found to have mildly displaced L intertrochanteric hip fx; pt s/p IM fixation 12/28/15.  Pt noted to be bradycardic pre and post-op.  PMH includes htn, abdominal hernia, paroxysmal a-fib, latex allergy, gout, CKD stage 3.   Clinical Impression    Pt. Is a 82 y.o. male who was admitted to [facility] for a L hip IM fixation with nailing on 12/28/15. Patient presents with excessive pain, limited ROM, weakness, limited activity tolerance, and impaired functional mobility for ADLs. Patient could benefit from skilled OT services for ADL and A/E retraining, and functional mobility for ADLs in order to improve overall ADL functioning, and return to PLOF.  He lived alone prior to surgery, but his son Mellody Dance is retired and lives in [place] and can help as needed.  Pt requires max assist x2 people for transfers and dependent for LB dressing currently.  He would benefit from SNF after discharge to continue his progress with ADLs.     Follow Up Recommendations  SNF    Equipment Recommendations  3 in 1 bedside comode;Tub/shower seat    Recommendations for Other Services       Precautions / Restrictions Precautions Precautions: Fall Restrictions Weight Bearing Restrictions: Yes LLE Weight Bearing: Partial weight bearing      Mobility Bed Mobility Overal bed mobility: Needs Assistance Bed Mobility: Supine to Sit     Supine to sit: Max assist;HOB elevated     General bed mobility comments: assist for trunk and B LE's; vc's required for bridging (using R LE) and scooting to edge of bed; vc's required for UE use; increased time and effort required by pt to perform  Transfers Overall transfer level: Needs assistance Equipment used: Rolling walker (2 wheeled) Transfers: Sit to/from  [employer] Sit to Stand: Mod assist;Max assist;+2 physical assistance (x2 trials with RW) Stand pivot transfers: Mod assist;Max assist;+2 physical assistance (stand pivot to R bed to chair)       General transfer comment: vc's required for hand and feet placement; increased effort noted by pt to perform above tasks with assist    Balance Overall balance assessment: Needs assistance Sitting-balance support: Bilateral upper extremity supported;Feet supported Sitting balance-Leahy Scale: Fair       Standing balance-Leahy Scale: Poor Standing balance comment: initial posterior lean upon standing                            ADL Overall ADL's : Needs assistance/impaired Eating/Feeding: Set up;Independent   Grooming: Set up;Independent           Upper Body Dressing : Independent;Set up   Lower Body Dressing: Total assistance (pt unable to participate in LB dressing due to pain even when leaning forward or moving his foot)                 General ADL Comments: Pt is currently requiring total assist for LB dressing due to pain with any movement and was not able to lean forward in chair slightly to demonstrate teach back of AE for LB dressing and demonstration and verbal instruction only this session.     Vision     Perception     Praxis      Pertinent Vitals/Pain Pain Assessment: 0-10 Pain Score:  3  Faces Pain Scale: Hurts whole lot (0/10 at rest; increased pain with movement) Pain Location: L hip Pain Descriptors / Indicators: Grimacing;Guarding Pain Intervention(s): Limited activity within patient's tolerance;Monitored during session;Premedicated before session;Ice applied     Hand Dominance Right   Extremity/Trunk Assessment Upper Extremity Assessment Upper Extremity Assessment: Overall WFL for tasks assessed   Lower Extremity Assessment Lower Extremity Assessment: Defer to PT evaluation RLE Deficits / Details: R hip flexion,  knee flexion/extension, and DF all at least 3/5 AROM LLE Deficits / Details: L hip flexion at least 2/5; L knee flexion/extension at least 3/5 AROM; L DF at least 3/5 LLE: Unable to fully assess due to pain   Cervical / Trunk Assessment Cervical / Trunk Assessment:  (mildly kyphotic)   Communication Communication Communication: HOH   Cognition Arousal/Alertness: Awake/alert Behavior During Therapy: WFL for tasks assessed/performed Overall Cognitive Status: Within Functional Limits for tasks assessed                     General Comments       Exercises       Shoulder Instructions      Home Living Family/patient expects to be discharged to:: Private residence Living Arrangements: Alone Available Help at Discharge: Family (son Mellody DanceKeith retired and lives in [place]) Type of Home: House Home Access: Stairs to enter [employer] CorporationEntrance Stairs-Number of Steps: 2 steps with R handle to grab onto   Home Layout: One level     Bathroom Shower/Tub: Tub/shower unit Shower/tub characteristics: Door FirefighterBathroom Toilet: Standard Bathroom Accessibility: Yes How Accessible: Accessible via walker Home Equipment: Cane - single point          Prior Functioning/Environment Level of Independence: Independent        Comments: Pt denies any other falls prior to current fall in last 6 months.  Pt ambulating without AD.    OT Diagnosis: Generalized weakness;Acute pain   OT Problem List: Decreased strength;Decreased range of motion;Decreased activity tolerance;Pain;Obesity;Decreased knowledge of use of DME or AE   OT Treatment/Interventions: Self-care/ADL training;Patient/family education;DME and/or AE instruction;Therapeutic activities    OT Goals(Current goals can be found in the care plan section) Acute Rehab OT Goals Patient Stated Goal: "to get pain under control" OT Goal Formulation: With patient/family Time For Goal Achievement: 01/12/16 Potential to Achieve Goals: Good ADL  Goals Pt Will Perform Lower Body Dressing: with mod assist;with adaptive equipment;sit to/from stand (with no LOB standing for pants over hips and using FWW) Pt Will Transfer to Toilet: with mod assist;bedside commode;stand pivot transfer (BSC over toilet) Pt Will Perform Toileting - Clothing Manipulation and hygiene: with min assist;sit to/from stand  OT Frequency: Min 1X/week   Barriers to D/C:    lives alone but son Mellody DanceKeith is retired and able to help him as needed       Co-evaluation              End of Session    Activity Tolerance: Patient limited by pain Patient left: in chair;with call bell/phone within reach;with chair alarm set   Time: 1015-1050 OT Time Calculation (min): 35 min Charges:  OT General Charges $OT Visit: 1 Procedure OT Evaluation $OT Eval Moderate Complexity: 1 Procedure OT Treatments $Self Care/Home Management : 8-22 mins G-Codes:     Susanne BordersSusan Wofford, OTR/L ascom [phone] 12/29/2015, 1:50 PM

## 2015-12-29 NOTE — Clinical Social Work Placement (Signed)
   CLINICAL SOCIAL WORK PLACEMENT  NOTE  Date:  12/29/2015  Patient Details  Name: Caleb GuerinJames C Hindes MRN: [id number] Date of Birth: 05/04/1927  Clinical Social Work is seeking post-discharge placement for this patient at the Skilled  Nursing Facility level of care (*CSW will initial, date and re-position this form in  chart as items are completed):  Yes   Patient/family provided with [place] Clinical Social Work Department's list of facilities offering this level of care within the geographic area requested by the patient (or if unable, by the patient's family).  Yes   Patient/family informed of their freedom to choose among providers that offer the needed level of care, that participate in Medicare, Medicaid or managed care program needed by the patient, have an available bed and are willing to accept the patient.  Yes   Patient/family informed of [facility] ownership interest in [facility] and [facility], as well as of the fact that they are under no obligation to receive care at these facilities.  PASRR submitted to EDS on 12/29/15     PASRR number received on 12/29/15     Existing PASRR number confirmed on       FL2 transmitted to all facilities in geographic area requested by pt/family on 12/29/15     FL2 transmitted to all facilities within larger geographic area on       Patient informed that his/her managed care company has contracts with or will negotiate with certain facilities, including the following:            Patient/family informed of bed offers received.  Patient chooses bed at       Physician recommends and patient chooses bed at      Patient to be transferred to   on  .  Patient to be transferred to facility by       Patient family notified on   of transfer.  Name of family member notified:        PHYSICIAN       Additional Comment:    _______________________________________________ Dede QuerySarah Kimra Kantor, LCSW 12/29/2015, 11:12 AM

## 2015-12-29 NOTE — Anesthesia Postprocedure Evaluation (Signed)
Anesthesia Post Note  Patient: Elayne GuerinJames C Ficco  Procedure(s) Performed: Procedure(s) (LRB): INTRAMEDULLARY (IM) NAIL INTERTROCHANTRIC (Left)  Patient location during evaluation: Nursing Unit Anesthesia Type: Spinal Level of consciousness: awake and alert and oriented Pain management: pain level controlled Vital Signs Assessment: post-procedure vital signs reviewed and stable Respiratory status: spontaneous breathing Cardiovascular status: stable Anesthetic complications: no    Last Vitals:  Filed Vitals:   12/29/15 0354 12/29/15 0725  BP: 103/46 109/43  Pulse: 57 49  Temp: 36.9 C 36.6 C  Resp: 19 18    Last Pain:  Filed Vitals:   12/29/15 0726  PainSc: Asleep                 Rica MastBachich,  Thena Devora M

## 2015-12-30 LAB — BASIC METABOLIC PANEL
ANION GAP: 7 (ref 5–15)
BUN: 40 mg/dL — AB (ref 6–20)
CALCIUM: 7.6 mg/dL — AB (ref 8.9–10.3)
CO2: 19 mmol/L — ABNORMAL LOW (ref 22–32)
Chloride: 109 mmol/L (ref 101–111)
Creatinine, Ser: 1.81 mg/dL — ABNORMAL HIGH (ref 0.61–1.24)
GFR calc Af Amer: 37 mL/min — ABNORMAL LOW (ref >60)
GFR, EST NON AFRICAN AMERICAN: 32 mL/min — AB (ref >60)
GLUCOSE: 105 mg/dL — AB (ref 65–99)
Potassium: 4.3 mmol/L (ref 3.5–5.1)
Sodium: 135 mmol/L (ref 135–145)

## 2015-12-30 LAB — CBC
HCT: 27.5 % — ABNORMAL LOW (ref 40.0–52.0)
Hemoglobin: 9.4 g/dL — ABNORMAL LOW (ref 13.0–18.0)
MCH: 35.2 pg — ABNORMAL HIGH (ref 26.0–34.0)
MCHC: 34.3 g/dL (ref 32.0–36.0)
MCV: 102.7 fL — AB (ref 80.0–100.0)
PLATELETS: 117 10*3/uL — AB (ref 150–440)
RBC: 2.68 MIL/uL — ABNORMAL LOW (ref 4.40–5.90)
RDW: 13.6 % (ref 11.5–14.5)
WBC: 11 10*3/uL — AB (ref 3.8–10.6)

## 2015-12-30 MED ORDER — FERROUS SULFATE 325 (65 FE) MG PO TABS: 325.0000 mg | ORAL_TABLET | Freq: Three times a day (TID) | ORAL | Status: AC

## 2015-12-30 MED ORDER — ENOXAPARIN SODIUM 40 MG/0.4ML ~~LOC~~ SOLN: 40.0000 mg | SUBCUTANEOUS | Status: DC

## 2015-12-30 MED ORDER — ENOXAPARIN SODIUM 40 MG/0.4ML ~~LOC~~ SOLN: 40.0000 mg | SUBCUTANEOUS | Status: AC

## 2015-12-30 MED ORDER — OXYCODONE-ACETAMINOPHEN 5-325 MG PO TABS: 1.0000 | ORAL_TABLET | ORAL | Status: AC | PRN

## 2015-12-30 NOTE — Progress Notes (Signed)
Physical Therapy Treatment Patient Details Name: Newt C BeaElayne Guerinver MRN: [id number] DOB: 09/04/1926 Today's Date: 12/30/2015    History of Present Illness Pt is an 82 y.o. male presenting s/p fall  off ladder and found to have mildly displaced L intertrochanteric hip fx; pt s/p IM fixation 12/28/15.  Pt noted to be bradycardic pre and post-op.  PMH includes htn, abdominal hernia, paroxysmal a-fib, latex allergy, gout, CKD stage 3.    PT Comments    Pt able to progress to stepping forward and back x3 reps with R LE (increased UE support through RW to maintain PWB'ing status) but unable to advance L LE (pt able to pull L LE backwards but not advance forwards).  Will continue to progress pt with decreasing assist levels with bed mobility, transfers, and advance gait as able.  Continue to recommend STR.   Follow Up Recommendations  SNF     Equipment Recommendations  Rolling walker with 5" wheels    Recommendations for Other Services       Precautions / Restrictions Precautions Precautions: Fall Restrictions Weight Bearing Restrictions: Yes LLE Weight Bearing: Partial weight bearing    Mobility  Bed Mobility Overal bed mobility: Needs Assistance Bed Mobility: Supine to Sit;Sit to Supine     Supine to sit: Max assist;HOB elevated Sit to supine: Max assist;HOB elevated   General bed mobility comments: assist for L LE and trunk; vc's required for technique  Transfers Overall transfer level: Needs assistance Equipment used: Rolling walker (2 wheeled) Transfers: Sit to/from Stand Sit to Stand: Mod assist;+2 physical assistance         General transfer comment: x4 trials; vc's required for hand and feet placement  Ambulation/Gait Ambulation/Gait assistance: Mod assist;+2 physical assistance Ambulation Distance (Feet):  (pt stepped forward/backward x3x's with R LE (pt unable to advance L LE on his own)) Assistive device: Rolling walker (2 wheeled)       General Gait Details:  vc's required for increasing UE support through RW to maintain PWB'ing status; pt unable to advance L LE on his own   Stairs            Wheelchair Mobility    Modified Rankin (Stroke Patients Only)       Balance Overall balance assessment: Needs assistance Sitting-balance support: Bilateral upper extremity supported;Feet supported Sitting balance-Leahy Scale: Good     Standing balance support: Bilateral upper extremity supported (on RW) Standing balance-Leahy Scale: Fair                      Cognition Arousal/Alertness: Awake/alert Behavior During Therapy: WFL for tasks assessed/performed Overall Cognitive Status: Within Functional Limits for tasks assessed                      Exercises      General Comments General comments (skin integrity, edema, etc.): L hip honeycomb dressing in place  Pt agreeable to PT session.      Pertinent Vitals/Pain Pain Score: 6  (2/10 at rest; 6/10 with activity) Pain Location: L hip Pain Descriptors / Indicators: Tender;Sore;Guarding;Grimacing Pain Intervention(s): Limited activity within patient's tolerance;Monitored during session;Repositioned    Home Living                      Prior Function            PT Goals (current goals can now be found in the care plan section) Acute Rehab PT Goals Patient Stated Goal: "to  get pain under control" PT Goal Formulation: With patient Time For Goal Achievement: 01/12/16 Potential to Achieve Goals: Good Progress towards PT goals: Progressing toward goals    Frequency  BID    PT Plan Current plan remains appropriate    Co-evaluation             End of Session Equipment Utilized During Treatment: Gait belt Activity Tolerance: Patient limited by fatigue;Patient limited by pain Patient left: in bed;with call bell/phone within reach;with bed alarm set (OT present for session and OT reported she would set pt up end of session)     Time:  1553-1616 PT Time Calculation (min) (ACUTE ONLY): 23 min  Charges:   $Therapeutic Activity: 23-37 mins                    G CodesHendricks Limes January 08, 2016, 4:29 PM Hendricks Limes, PT [phone]

## 2015-12-30 NOTE — Progress Notes (Signed)
Occupational Therapy Treatment Patient Details Name: Caleb Walker MRN: [id number] DOB: 09/04/1926 Today's Date: 12/30/2015    History of present illness Pt is an 82 y.o. male presenting s/p fall  off ladder and found to have mildly displaced L intertrochanteric hip fx; pt s/p IM fixation 12/28/15.  PMH includes htn, abdominal hernia, paroxysmal a-fib, latex allergy, gout, CKD stage 3.   OT comments  Pt. Continues to present with pain, limited functional mobility for ADLs, decreased strength, and decreased activity tolerance which hinder his ability to complete ADL and IADL functioning. Pt. Continues to benefit from skilled OT services for ADL training, A/E training, UE therapeutic ex, functional mobility for ADLs, work simplification strategies, and home modifications in order to improve ADL and IADL functioning. Pt. Is planning to go to SNF level of care.   Follow Up Recommendations  SNF    Equipment Recommendations       Recommendations for Other Services      Precautions / Restrictions Precautions Precautions: Fall Restrictions Weight Bearing Restrictions: Yes LLE Weight Bearing: Partial weight bearing       Mobility Bed Mobility   Balance  Pt. just returned to bed with PT. Pt. declined attempts for OOB and EOB activity. Session was bedside.                 ADL       Grooming: Set up               Lower Body Dressing: Total assistance (From bed)                 General ADL Comments: Pt. education about A/E use, DME, and home set-up/ modifications.      Vision                     Perception     Praxis      Cognition   Behavior During Therapy: WFL for tasks assessed/performed Overall Cognitive Status: Within Functional Limits for tasks assessed                       Extremity/Trunk Assessment               Exercises     Shoulder Instructions       General Comments      Pertinent Vitals/ Pain       Pain  Assessment: 0-10 Pain Score: 2  Pain Location: Left Hip Pain Descriptors / Indicators: Tender;Sore;Guarding;Grimacing Pain Intervention(s): Limited activity within patient's tolerance;Monitored during session;Repositioned  Home Living                                          Prior Functioning/Environment              Frequency Min 1X/week     Progress Toward Goals  OT Goals(current goals can now be found in the care plan section)     Acute Rehab OT Goals Patient Stated Goal: To live independently  Plan Discharge plan remains appropriate    Co-evaluation                 End of Session Equipment Utilized During Treatment: Gait belt   Activity Tolerance Patient limited by pain   Patient Left with call bell/phone within reach;with bed alarm set;in bed   Nurse Communication  Time: 8469-6295: 1622-1637 OT Time Calculation (min): 15 min  Charges: OT General Charges $OT Visit: 1 Procedure OT Treatments $Self Care/Home Management : 8-22 mins   Caleb MessierElaine Desree Leap, MS, OTR/L   Caleb Walker 12/30/2015, 4:50 PM

## 2015-12-30 NOTE — Discharge Summary (Addendum)
[facility] Physicians - [place] at [facility]   PATIENT NAME: Caleb Walker    MR#:  [id number]  DATE OF BIRTH:  1927/06/06  DATE OF ADMISSION:  12/27/2015 ADMITTING PHYSICIAN: Enedina Finner, MD  DATE OF DISCHARGE: 12/30/15  PRIMARY CARE PHYSICIAN: Lauro Regulus., MD    ADMISSION DIAGNOSIS:  Intertrochanteric fracture of left hip, closed, initial encounter (HCC) [S72.142A]  DISCHARGE DIAGNOSIS:  Left hip Intertrochanteric fracture s/p surgery Chronic afib HTN  SECONDARY DIAGNOSIS:   Past Medical History  Diagnosis Date  . Atrial fibrillation (HCC)   . Hypertension   . Hernia, abdominal     HOSPITAL COURSE:  Caleb Walker is a 82 y.o. male with a known history of Paroxysmal A. fib, gout, tenacious anemia, osteoarthritis, hyperlipidemia, chronic kidney disease stage III who presented with a fall and noted to have a left hip fracture.  1. Acute left hip intertrochanteric fracture status post mechanical fall POD #3 -Seen by orthopedic surgery and is status post intramedullary fixation of the hip. -Continue pain control and care as per orthopedics. Continue physical therapy as tolerated.  2. Paroxysmal A. Fib-currently in sinus rhythm but intermittent  bradycardic. - will give Cardizem with holding parameters. Hold if HR <60. Follow heart rate. Hemodynamically stable. -pt asymptomatic  3. Hypertension-blood pressure but on the low side. Asymptomatic. -resume cardizem with holding parameter. Hold if SBP<120 - IV when necessary hydralazine  4. Chronic kidney disease stage III - Cr. Close to baseline and will monitor.  - renal dose meds and avoid nephrotoxins.   5. BPH - cont. Finasteride  6. Hx of Gout - no acute attack  - cont. Uloric.   d/c  to rehab  CONSULTS OBTAINED:  Treatment Team:  Juanell Fairly, MD  DRUG ALLERGIES:   Allergies  Allergen Reactions  . Latex Rash    DISCHARGE MEDICATIONS:   Current Discharge Medication List     START taking these medications   Details  enoxaparin (LOVENOX) 40 MG/0.4ML injection Inject 0.4 mLs (40 mg total) into the skin daily. Qty: 14 Syringe, Refills: 0    ferrous sulfate 325 (65 FE) MG tablet Take 1 tablet (325 mg total) by mouth 3 (three) times daily after meals. Qty: 90 tablet, Refills: 3    oxyCODONE-acetaminophen (ROXICET) 5-325 MG tablet Take 1 tablet by mouth every 4 (four) hours as needed for moderate pain or severe pain. Qty: 30 tablet, Refills: 0      CONTINUE these medications which have NOT CHANGED   Details  cholecalciferol (VITAMIN D) 1000 units tablet Take 1,000 Units by mouth daily.    diltiazem (CARDIZEM CD) 120 MG 24 hr capsule Take 120 mg by mouth daily.    febuxostat (ULORIC) 40 MG tablet Take 40 mg by mouth daily.    finasteride (PROSCAR) 5 MG tablet Take 5 mg by mouth daily.    fluticasone (FLONASE) 50 MCG/ACT nasal spray Place 2 sprays into both nostrils at bedtime as needed for rhinitis.     Multiple Vitamin (MULTIVITAMIN WITH MINERALS) TABS tablet Take 1 tablet by mouth daily.    vitamin B-12 (CYANOCOBALAMIN) 1000 MCG tablet Take 1,000 mcg by mouth daily.    vitamin C (ASCORBIC ACID) 500 MG tablet Take 500 mg by mouth daily.    vitamin E 400 UNIT capsule Take 400 Units by mouth daily.        If you experience worsening of your admission symptoms, develop shortness of breath, life threatening emergency, suicidal or homicidal thoughts you must seek  medical attention immediately by calling 911 or calling your MD immediately  if symptoms less severe.  You Must read complete instructions/literature along with all the possible adverse reactions/side effects for all the Medicines you take and that have been prescribed to you. Take any new Medicines after you have completely understood and accept all the possible adverse reactions/side effects.   Please note  You were cared for by a hospitalist during your hospital stay. If you have any questions  about your discharge medications or the care you received while you were in the hospital after you are discharged, you can call the unit and asked to speak with the hospitalist on call if the hospitalist that took care of you is not available. Once you are discharged, your primary care physician will handle any further medical issues. Please note that NO REFILLS for any discharge medications will be authorized once you are discharged, as it is imperative that you return to your primary care physician (or establish a relationship with a primary care physician if you do not have one) for your aftercare needs so that they can reassess your need for medications and monitor your lab values. Today   SUBJECTIVE   No complaints  VITAL SIGNS:  Blood pressure 116/52, pulse 53, temperature 97.6 F (36.4 C), temperature source Oral, resp. rate 18, height 5\' 11"  (1.803 m), weight 92.987 kg (205 lb), SpO2 98 %.  I/O:   Intake/Output Summary (Last 24 hours) at 12/30/15 1015 Last data filed at 12/30/15 0931  Gross per 24 hour  Intake    355 ml  Output   1250 ml  Net   -895 ml    PHYSICAL EXAMINATION:  GENERAL:  82 y.o.-year-old patient lying in the bed with no acute distress.  EYES: Pupils equal, round, reactive to light and accommodation. No scleral icterus. Extraocular muscles intact.  HEENT: Head atraumatic, normocephalic. Oropharynx and nasopharynx clear.  NECK:  Supple, no jugular venous distention. No thyroid enlargement, no tenderness.  LUNGS: Normal breath sounds bilaterally, no wheezing, rales,rhonchi or crepitation. No use of accessory muscles of respiration.  CARDIOVASCULAR: S1, S2 normal. No murmurs, rubs, or gallops.  ABDOMEN: Soft, non-tender, non-distended. Bowel sounds present. No organomegaly or mass.  EXTREMITIES: No pedal edema, cyanosis, or clubbing.  NEUROLOGIC: Cranial nerves II through XII are intact. Muscle strength 5/5 in all extremities. Sensation intact. Gait not checked.   PSYCHIATRIC: The patient is alert and oriented x 3.  SKIN: No obvious rash, lesion, or ulcer.   DATA REVIEW:   CBC   Recent Labs Lab 12/30/15 0518  WBC 11.0*  HGB 9.4*  HCT 27.5*  PLT 117*    Chemistries   Recent Labs Lab 12/30/15 0518  NA 135  K 4.3  CL 109  CO2 19*  GLUCOSE 105*  BUN 40*  CREATININE 1.81*  CALCIUM 7.6*    Microbiology Results   Recent Results (from the past 240 hour(s))  Urine culture     Status: Abnormal   Collection Time: 12/27/15 10:59 PM  Result Value Ref Range Status   Specimen Description URINE, RANDOM  Final   Special Requests NONE  Final   Culture (A)  Final    6,000 COLONIES/mL INSIGNIFICANT GROWTH Performed [facility]    Report Status 12/29/2015 FINAL  Final  Surgical PCR screen     Status: Abnormal   Collection Time: 12/27/15 11:35 PM  Result Value Ref Range Status   MRSA, PCR NEGATIVE NEGATIVE Final   Staphylococcus  aureus POSITIVE (A) NEGATIVE Final    RADIOLOGY:  Dg Hip Port Unilat With Pelvis 1v Left  12/28/2015  CLINICAL DATA:  Left hip fracture surgery EXAM: DG HIP (WITH OR WITHOUT PELVIS) 1V PORT LEFT COMPARISON:  12/27/2015 FINDINGS: Status post dynamic hip screw with IM nail fixation of an intertrochanteric left hip fracture. Fracture fragments are in near anatomic alignment and position. Overlying soft tissue gas and skin staples. No additional fracture is seen. Visualized bony pelvis is intact. Status post right inguinal hernia repair. IMPRESSION: Status post ORIF of an intertrochanteric left hip fracture, as above. Electronically Signed   By: Charline Bills M.D.   On: 12/28/2015 14:53   Dg Hip Operative Unilat W Or W/o Pelvis Left  12/28/2015  CLINICAL DATA:  82 year old male status post ORIF for left hip intertrochanteric fracture. EXAM: OPERATIVE LEFT HIP (WITH PELVIS IF PERFORMED) 4 VIEWS TECHNIQUE: Fluoroscopic spot image(s) were submitted for interpretation post-operatively. COMPARISON:  12/27/2015.  FINDINGS: 4 intraoperative fluoroscopic spot views of the left hip document interval placement of a gamma nail fixation device traversing the previously noted intertrochanteric hip fracture. Alignment appears anatomic. Left femoral head is properly located. No periprosthetic fracture or other immediate complicating features are identified. IMPRESSION: 1. Status post ORIF in the left hand for intertrochanteric hip fracture, without acute complicating features. Electronically Signed   By: Trudie Reed M.D.   On: 12/28/2015 13:46     Management plans discussed with the patient, family and they are in agreement.  CODE STATUS:  Code Status History    Date Active Date Inactive Code Status Order ID Comments User Context   12/27/2015  7:18 PM 12/28/2015  2:59 PM Full Code [id number]  Enedina Finner, MD ED   12/27/2015  5:45 PM 12/27/2015  7:18 PM Full Code [id number]  Juanell Fairly, MD ED    Advance Directive Documentation        Most Recent Value   Type of Advance Directive  Healthcare Power of Attorney, Living will   Pre-existing out of facility DNR order (yellow form or pink MOST form)     "MOST" Form in Place?        TOTAL TIME TAKING CARE OF THIS PATIENT: 40 minutes.    Macaiah Mangal M.D on 12/31/2015  Between 7am to 6pm - Pager - [phone] After 6pm go to [email or web address] - password [email or web address] [facility]  [place] [facility]  Office  [phone]  CC: Primary care physician; Lauro Regulus., MD

## 2015-12-30 NOTE — Discharge Summary (Signed)
Patient will be partial weightbearing on the left lower extremity with the assistance of a walker x 4 weeks. Patient should continue to receive physical therapy for hip and lower extremity range of motion, strengthening and gait training. He should elevate the left lower extremity whenever possible and may apply ice to the surgical site to help reduce swelling. Dressing should be changed when necessary basis if drainage is observed. Continue DVT prophylaxis for 4-6 weeks postop. Patient will follow with orthopedic surgeon in his office in 10-14 days. Staples will be removed in the orthopedic office.  [facility], Dr. Kamil Mchaffie, [phone] °

## 2015-12-30 NOTE — Discharge Instructions (Signed)
Patient will be partial weightbearing on the left lower extremity with the assistance of a walker x 4 weeks. Patient should continue to receive physical therapy for hip and lower extremity range of motion, strengthening and gait training. He should elevate the left lower extremity whenever possible and may apply ice to the surgical site to help reduce swelling. Dressing should be changed when necessary basis if drainage is observed. Continue DVT prophylaxis for 4-6 weeks postop. Patient will follow with orthopedic surgeon in his office in 10-14 days. Staples will be removed in the orthopedic office.  [facility], Dr. Martha ClanKrasinski, [phone]

## 2015-12-30 NOTE — Progress Notes (Signed)
Patient will stay one more midnight per SW, tentative discharge to SNF/rehab on 12/31/15

## 2015-12-30 NOTE — Progress Notes (Signed)
Subjective:  Postoperative day #2 status post intramedullary fixation for left intertrochanteric hip fracture. Patient reports pain as minimal at rest with marked pain during activities requiring left hip motion.  Patient has been cleared medically for discharge to skilled nursing facility today.  Objective:   VITALS:   Filed Vitals:   12/29/15 2027 12/29/15 2029 12/30/15 0422 12/30/15 0818  BP: 125/46 122/46 107/50 116/52  Pulse: 51 49 51 53  Temp: 98.1 F (36.7 C)  97.5 F (36.4 C) 97.6 F (36.4 C)  TempSrc: Oral  Oral Oral  Resp: 19  18 18   Height:      Weight:      SpO2: 97%  95% 98%    PHYSICAL EXAM:  Left lower extremity: Patient's dressing remained clean dry and intact. There is no active drainage from his incision. Patient's thigh compartments are soft and compressible as are his lower leg compartments. He has no calf tenderness or lower leg edema.  He has palpable pedal pulses, intact sensation light touch in both lower extremities. Patient has intact motor function in both lower extremities as well with 5 out of 5 strength to manual testing.  LABS  Results for orders placed or performed during the hospital encounter of 12/27/15 (from the past 24 hour(s))  CBC     Status: Abnormal   Collection Time: 12/30/15  5:18 AM  Result Value Ref Range   WBC 11.0 (H) 3.8 - 10.6 K/uL   RBC 2.68 (L) 4.40 - 5.90 MIL/uL   Hemoglobin 9.4 (L) 13.0 - 18.0 g/dL   HCT 91.427.5 (L) 78.240.0 - 95.652.0 %   MCV 102.7 (H) 80.0 - 100.0 fL   MCH 35.2 (H) 26.0 - 34.0 pg   MCHC 34.3 32.0 - 36.0 g/dL   RDW 21.313.6 08.611.5 - 57.814.5 %   Platelets 117 (L) 150 - 440 K/uL  Basic metabolic panel     Status: Abnormal   Collection Time: 12/30/15  5:18 AM  Result Value Ref Range   Sodium 135 135 - 145 mmol/L   Potassium 4.3 3.5 - 5.1 mmol/L   Chloride 109 101 - 111 mmol/L   CO2 19 (L) 22 - 32 mmol/L   Glucose, Bld 105 (H) 65 - 99 mg/dL   BUN 40 (H) 6 - 20 mg/dL   Creatinine, Ser 4.691.81 (H) 0.61 - 1.24 mg/dL   Calcium 7.6 (L) 8.9 - 10.3 mg/dL   GFR calc non Af Amer 32 (L) >60 mL/min   GFR calc Af Amer 37 (L) >60 mL/min   Anion gap 7 5 - 15    Dg Hip Port Unilat With Pelvis 1v Left  12/28/2015  CLINICAL DATA:  Left hip fracture surgery EXAM: DG HIP (WITH OR WITHOUT PELVIS) 1V PORT LEFT COMPARISON:  12/27/2015 FINDINGS: Status post dynamic hip screw with IM nail fixation of an intertrochanteric left hip fracture. Fracture fragments are in near anatomic alignment and position. Overlying soft tissue gas and skin staples. No additional fracture is seen. Visualized bony pelvis is intact. Status post right inguinal hernia repair. IMPRESSION: Status post ORIF of an intertrochanteric left hip fracture, as above. Electronically Signed   By: Charline BillsSriyesh  Krishnan M.D.   On: 12/28/2015 14:53    Assessment/Plan: 2 Days Post-Op   Active Problems:   Hip fracture [facility](HCC)  Patient is doing well from orthopedic standpoint. He'll continue physical and occupational therapy for hip range of motion, gait training and lower extremity strengthening.  Patient will be partial weightbearing on the left  lower extremity with the assistance of a walker x 4 weeks. Patient should continue to receive physical therapy for hip and lower extremity range of motion, strengthening and gait training. He should elevate the left lower extremity whenever possible and may apply ice to the surgical site to help reduce swelling. Dressing should be changed when necessary basis if drainage is observed. Continue DVT prophylaxis for 4-6 weeks postop. Patient will follow with orthopedic surgeon in his office in 10-14 days. Staples will be removed in the orthopedic office.  [facility], Dr. Martha Clan, [phone]    Juanell Fairly , MD 12/30/2015, 1:45 PM

## 2015-12-30 NOTE — Progress Notes (Signed)
Physical Therapy Treatment Patient Details Name: Caleb GuerinJames C Walker MRN: [id number] DOB: 01/16/1927 Today's Date: 12/30/2015    History of Present Illness Pt is an 82 y.o. male presenting s/p fall  off ladder and found to have mildly displaced L intertrochanteric hip fx; pt s/p IM fixation 12/28/15.  Pt noted to be bradycardic pre and post-op.  PMH includes htn, abdominal hernia, paroxysmal a-fib, latex allergy, gout, CKD stage 3.    PT Comments    Pt was with increased fatigue this morning secondary to having just transferred to/from commode with RN staff. Reports that he experienced significant discomfort and "wooziness" during the transfers, and thus is refusing OOB activity at this time. He was agreeable to participate in supine TherEx and ROM activities, which he tolerated well. Will re-attempt OOB activity at next session. Remains a good candidate for STR.    Follow Up Recommendations  SNF     Equipment Recommendations  Rolling walker with 5" wheels       Precautions / Restrictions Precautions Precautions: Fall Restrictions Weight Bearing Restrictions: Yes LLE Weight Bearing: Partial weight bearing    Mobility  Bed Mobility   General bed mobility comments: Not assessed 2/2 pt refusal. States he was just up to the toilet with nursing staff and is too worn out to get OOB again.  Transfers   General transfer comment: Not assessed 2/2 pt refusal. States he was just up to the toilet with nursing staff and is too worn out to get OOB again.  Ambulation/Gait   General Gait Details: Not assessed 2/2 pt refusal. States he was just up to the toilet with nursing staff and is too worn out to get OOB again.   Stairs                Cognition Arousal/Alertness: Awake/alert Behavior During Therapy: WFL for tasks assessed/performed Overall Cognitive Status: Within Functional Limits for tasks assessed      Exercises General Exercises - Lower Extremity Ankle Circles/Pumps:  AROM;Strengthening;Both;10 reps;Supine Quad Sets: AROM;Strengthening;Both;10 reps;Supine Short Arc Quad: Strengthening;Both;10 reps;Seated (AROM R; AAROM L) Heel Slides: Strengthening;Both;10 reps;Supine (AROM R; AAROM L) Hip ABduction/ADduction: Strengthening;Both;10 reps;Supine (AROM R; AAROM L) Straight Leg Raises: Strengthening;Both;10 reps;Supine (AROM R; AAROM L (limited range ~10 degrees))    General Comments  Nursing consulted who reports having just transferred pt to/from commode which required physical assist x3. RN cleared pt for participation in physical therapy. Pt agreeable to bed-level activity.     Pertinent Vitals/Pain Pain Assessment: 0-10 Pain Score: 2  Pain Location: L hip Pain Intervention(s): Limited activity within patient's tolerance;Monitored during session;Premedicated before session;Repositioned           PT Goals (current goals can now be found in the care plan section) Acute Rehab PT Goals Patient Stated Goal: "to get pain under control" PT Goal Formulation: With patient Time For Goal Achievement: 01/12/16 Potential to Achieve Goals: Good Progress towards PT goals: Progressing toward goals    Frequency  BID    PT Plan Current plan remains appropriate       End of Session Equipment Utilized During Treatment: Gait belt Activity Tolerance: Patient limited by fatigue;Patient limited by pain Patient left: in bed;with call bell/phone within reach;with bed alarm set;with SCD's reapplied     Time: [id number] PT Time Calculation (min) (ACUTE ONLY): 23 min  Charges:                       G Codes:  Caleb Walker, SPT 12/30/2015, 1:14 PM

## 2015-12-31 LAB — CBC
HEMATOCRIT: 28.6 % — AB (ref 40.0–52.0)
HEMOGLOBIN: 9.8 g/dL — AB (ref 13.0–18.0)
MCH: 35.1 pg — AB (ref 26.0–34.0)
MCHC: 34.2 g/dL (ref 32.0–36.0)
MCV: 102.5 fL — AB (ref 80.0–100.0)
Platelets: 129 10*3/uL — ABNORMAL LOW (ref 150–440)
RBC: 2.79 MIL/uL — AB (ref 4.40–5.90)
RDW: 13.5 % (ref 11.5–14.5)
WBC: 10.4 10*3/uL (ref 3.8–10.6)

## 2015-12-31 NOTE — Progress Notes (Signed)
PT Cancellation Note  Patient Details Name: Caleb Walker MRN: [id number] DOB: 07/30/1926   Cancelled Treatment:    Reason Eval/Treat Not Completed: Patient declined, no reason specified (Leaving for SNF and nsg to give meds, will talk with him).  Will see later if pt decides to participate.   Ivar DrapeStout, Layli Capshaw E 12/31/2015, 10:48 AM    Samul Dadauth Auna Mikkelsen, PT MS Acute Rehab Dept. Number: [facility] [phone] and [facility] [phone]

## 2015-12-31 NOTE — Progress Notes (Signed)
Clinical Social Worker was informed that patient will be medically ready to discharge to [facility]. Patient and his family are in a agreement with plan. CSW called Jomarie LongsJoseph- Admissions Coordinator at [facility] to confirm that patient's bed is ready. Provided patient's room number 803 and number to call for report [address] RN at [phone] . All discharge information faxed to [facility] via [facility]. Rx's added to discharge packet.   Call to patient's son-Kieth, left message to inform him patient would discharge to [facility]. RN will call report and patient will discharge to [facility] via [facility].  Woodroe Modehristina Nalaysia Manganiello, MSW, LCSW-A Clinical Social Work Department [phone]

## 2015-12-31 NOTE — Progress Notes (Addendum)
Patient ID: Caleb Walker, male   DOB: 1927-05-09, 82 y.o.   MRN: [id number] [facility] - [place] at [facility]   PATIENT NAME: Caleb Walker    MR#:  [id number]  DATE OF BIRTH:  [birth year]  SUBJECTIVE:  POd#2 some hip apin with acitivity  REVIEW OF SYSTEMS:   Review of Systems  Constitutional: Negative for fever, chills and weight loss.  HENT: Negative for ear discharge, ear pain and nosebleeds.   Eyes: Negative for blurred vision, pain and discharge.  Respiratory: Negative for sputum production, shortness of breath, wheezing and stridor.   Cardiovascular: Negative for chest pain, palpitations, orthopnea and PND.  Gastrointestinal: Negative for nausea, vomiting, abdominal pain and diarrhea.  Genitourinary: Negative for urgency and frequency.  Musculoskeletal: Positive for joint pain. Negative for back pain.  Neurological: Negative for sensory change, speech change, focal weakness and weakness.  Psychiatric/Behavioral: Negative for depression and hallucinations. The patient is not nervous/anxious.   All other systems reviewed and are negative.  Tolerating Diet:yes Tolerating PT: yes  DRUG ALLERGIES:   Allergies  Allergen Reactions  . Latex Rash    VITALS:  Blood pressure 116/42, pulse 61, temperature 98.5 F (36.9 C), temperature source Oral, resp. rate 18, height  (1.803 m), weight 92.987 kg (205 lb), SpO2 94 %.  PHYSICAL EXAMINATION:   Physical Exam  GENERAL:  82 y.o.-year-old patient lying in the bed with no acute distress.  EYES: Pupils equal, round, reactive to light and accommodation. No scleral icterus. Extraocular muscles intact.  HEENT: Head atraumatic, normocephalic. Oropharynx and nasopharynx clear.  NECK:  Supple, no jugular venous distention. No thyroid enlargement, no tenderness.  LUNGS: Normal breath sounds bilaterally, no wheezing, rales, rhonchi. No use of accessory muscles of respiration.  CARDIOVASCULAR: S1, S2 normal.  No murmurs, rubs, or gallops.  ABDOMEN: Soft, nontender, nondistended. Bowel sounds present. No organomegaly or mass.  EXTREMITIES: No cyanosis, clubbing or edema b/l.    NEUROLOGIC: Cranial nerves II through XII are intact. No focal Motor or sensory deficits b/l.   PSYCHIATRIC:  patient is alert and oriented x 3.  SKIN: No obvious rash, lesion, or ulcer.   LABORATORY PANEL:  CBC  Recent Labs Lab 12/31/15 0255  WBC 10.4  HGB 9.8*  HCT 28.6*  PLT 129*    Chemistries   Recent Labs Lab 12/30/15 0518  NA 135  K 4.3  CL 109  CO2 19*  GLUCOSE 105*  BUN 40*  CREATININE 1.81*  CALCIUM 7.6*   Cardiac Enzymes No results for input(s): TROPONINI in the last 168 hours. RADIOLOGY:  No results found. ASSESSMENT AND PLAN:  Caleb Walker is a 82 y.o. male with a known history of Paroxysmal A. fib, gout, tenacious anemia, osteoarthritis, hyperlipidemia, chronic kidney disease stage III who presented with a fall and noted to have a left hip fracture.  1. Acute left hip intertrochanteric fracture status post mechanical fall POD #2 -Seen by orthopedic surgery and is status post intramedullary fixation of the hip. -Continue pain control and care as per orthopedics. Continue physical therapy as tolerated.  2. Paroxysmal A. Fib-currently in sinus rhythm but intermittent bradycardic. - will give Cardizem with holding parameters. Hold if HR <60. Follow heart rate. Hemodynamically stable. -pt asymptomatic  3. Hypertension-blood pressure but on the low side. Asymptomatic. -resume cardizem with holding parameter. Hold if SBP<120 - IV when necessary hydralazine  4. Chronic kidney disease stage III - Cr. Close to baseline and will monitor.  - renal dose  meds and avoid nephrotoxins.   5. BPH - cont. Finasteride  6. Hx of Gout - no acute attack  - cont. Uloric.   D/c to rehab once insurance auth provided Case discussed with Care Management/Social Worker. Management plans discussed  with the patient, family and they are in agreement.  CODE STATUS: full  DVT Prophylaxis: lovenox TOTAL TIME TAKING CARE OF THIS PATIENT:30 minutes.  >50% time spent on counselling and coordination of care   Note: This dictation was prepared with Dragon dictation along with smaller phrase technology. Any transcriptional errors that result from this process are unintentional.  Caleb Walker M.D on 12/31/2015 at 7:04 AM  Between 7am to 6pm - Pager - [phone]  After 6pm go to [email or web address] - password [email or web address] [facility]  [facility] [place] [facility]  Office  [phone]  CC: Primary care physician; Lauro RegulusANDERSON,MARSHALL W., MD

## 2015-12-31 NOTE — Clinical Social Work Placement (Signed)
   CLINICAL SOCIAL WORK PLACEMENT  NOTE  Date:  12/31/2015  Patient Details  Name: Caleb Walker MRN: [id number] Date of Birth: 10/18/1926  Clinical Social Work is seeking post-discharge placement for this patient at the Skilled  Nursing Facility level of care (*CSW will initial, date and re-position this form in  chart as items are completed):  Yes   Patient/family provided with [facility] Clinical Social Work Department's list of facilities offering this level of care within the geographic area requested by the patient (or if unable, by the patient's family).  Yes   Patient/family informed of their freedom to choose among providers that offer the needed level of care, that participate in Medicare, Medicaid or managed care program needed by the patient, have an available bed and are willing to accept the patient.  Yes   Patient/family informed of [facility]'s ownership interest in [facility] and [facility], as well as of the fact that they are under no obligation to receive care at these facilities.  PASRR submitted to EDS on 12/29/15     PASRR number received on 12/29/15     Existing PASRR number confirmed on       FL2 transmitted to all facilities in geographic area requested by pt/family on 12/29/15     FL2 transmitted to all facilities within larger geographic area on       Patient informed that his/her managed care company has contracts with or will negotiate with certain facilities, including the following:        Yes   Patient/family informed of bed offers received.  [facility] [place]     Physician recommends and patient chooses bed at      [facility] to be [facility] to  ([facility] on 12/31/15.  Patient to be transferred to facility by  (EMS)     Patient family notified on 12/31/15 of transfer.  Name of family member notified:   (Son)     PHYSICIAN       Additional Comment:     _______________________________________________ Idamae Lusherhristina E Ivanell Deshotel, LCSW 12/31/2015, 8:59 AM

## 2015-12-31 NOTE — Clinical Social Work Note (Signed)
Drucie Opitzetna auth is pending, per [facility]. However, bed at facility is not available and it will be ready tomorrow (12/31/2015). Pt is unsafe to return home. Pt declined to go to another facility. [facility] while Berkley Harveyauth is pending, therefore will not qualify for a LOG. MDs updated, as well as son. CSW will continue to follow.   Dede QuerySarah Sedona Wenk, MSW, LCSW Clinical Social Worker  [phone]

## 2015-12-31 NOTE — Progress Notes (Signed)
  Subjective:  POD #3 s/p intramedullary fixation of left intertrochanteric hip fracture.  Patient reports pain as minimal at rest.  Pain increases with PT.  Son at bedside.  No acute issues.  Objective:   VITALS:   Filed Vitals:   12/31/15 0527 12/31/15 0531 12/31/15 0730 12/31/15 0754  BP: 108/44 116/42 125/44 128/46  Pulse: 61  63   Temp: 98.5 F (36.9 C)  98.1 F (36.7 C)   TempSrc: Oral  Oral   Resp: 18  18   Height:      Weight:      SpO2: 94%  94%     PHYSICAL EXAM:  Left hip/lower extremity:  Dressing C/D/I.  NVI.  Thigh compartments soft and compressible.  No calf tenderness or LE edema.  Palpable pedal pulses.  +TEDS and foot pumps.  Intact motor and sensory function in both lower extremities.  LABS  Results for orders placed or performed during the hospital encounter of 12/27/15 (from the past 24 hour(s))  CBC     Status: Abnormal   Collection Time: 12/31/15  2:55 AM  Result Value Ref Range   WBC 10.4 3.8 - 10.6 K/uL   RBC 2.79 (L) 4.40 - 5.90 MIL/uL   Hemoglobin 9.8 (L) 13.0 - 18.0 g/dL   HCT 56.228.6 (L) 13.040.0 - 86.552.0 %   MCV 102.5 (H) 80.0 - 100.0 fL   MCH 35.1 (H) 26.0 - 34.0 pg   MCHC 34.2 32.0 - 36.0 g/dL   RDW 78.413.5 69.611.5 - 29.514.5 %   Platelets 129 (L) 150 - 440 K/uL    No results found.  Assessment/Plan: 3 Days Post-Op   Active Problems:   Hip fracture [facility](HCC)  Patient doing well from orthopaedic standpoint.  OK from ortho standpoint for discharge to SNF.  Follow up in 10-14 days.    Juanell FairlyKRASINSKI, Merick Kelleher , MD 12/31/2015, 12:59 PM

## 2015-12-31 NOTE — Progress Notes (Signed)
Occupational Therapy Treatment Patient Details Name: Caleb Walker MRN: [id number] DOB: 10-22-1926 Today's Date: 12/31/2015    History of present illness Pt is an 82 y.o. male presenting s/p fall  off ladder and found to have mildly displaced L intertrochanteric hip fx; pt s/p IM fixation 12/28/15.  PMH includes htn, abdominal hernia, paroxysmal a-fib, latex allergy, gout, CKD stage 3.   OT comments  Patient supine in bed when OT arrived. States he just completed PT and does not want to sit edge of bed or get out of bed because transport will be arriving soon, and he needs to rest. Willing to participate in education. Patient educated on AE for lower body dressing, states he recalls OT showing him AE yesterday. Able to recall use of each device with extra time and minimal cuing. Demonstration of use of AE performed. Patient verbalized understanding, but refused to return demonstrate at this time. States he will not be able to dress himself until his hip heals. Educated patient on rehab process, and need to participate in all tasks to increase strength, activity tolerance, and independence which will assist with healing process. Patient states he appreciates education, but will just wait until at SNF to participate more.    Follow Up Recommendations  SNF    Equipment Recommendations       Recommendations for Other Services      Precautions / Restrictions Precautions Precautions: Fall Restrictions Weight Bearing Restrictions: Yes LLE Weight Bearing: Partial weight bearing       Mobility Bed Mobility               General bed mobility comments: Pt. just returned back to bed with PT. Declined attempt for OOB or EOB activity.    Transfers                      Balance                                   ADL Overall ADL's : Needs assistance/impaired                     Lower Body Dressing: With adaptive equipment;Total assistance;Cueing for  safety;Cueing for compensatory techniques                 General ADL Comments: Pt. education about A/E use, DME, and home set-up modifications.      Vision                     Perception     Praxis      Cognition   Behavior During Therapy: [facility] for tasks assessed/performed Overall Cognitive Status: Within Functional Limits for tasks assessed                       Extremity/Trunk Assessment               Exercises     Shoulder Instructions       General Comments      Pertinent Vitals/ Pain       Pain Assessment: No/denies pain Pain Intervention(s): Monitored during session;Limited activity within patient's tolerance  Home Living  Prior Functioning/Environment              Frequency Min 1X/week     Progress Toward Goals  OT Goals(current goals can now be found in the care plan section)  Progress towards OT goals: Progressing toward goals  Acute Rehab OT Goals Patient Stated Goal: "to get pain under control" OT Goal Formulation: With patient/family Time For Goal Achievement: 01/12/16 Potential to Achieve Goals: Good  Plan Discharge plan remains appropriate    Co-evaluation                 End of Session     Activity Tolerance Patient limited by fatigue   Patient Left with call bell/phone within reach;with bed alarm set;in bed   Nurse Communication          Time: 1100-1114 OT Time Calculation (min): 14 min  Charges: OT General Charges $OT Visit: 1 Procedure OT Treatments $Self Care/Home Management : 8-22 mins  Bri Wakeman L 12/31/2015, 1:00 PM Kirstie PeriWendy Lachlan Pelto, OTR/L

## 2016-01-29 ENCOUNTER — Emergency Department: Payer: Medicare HMO

## 2016-01-29 ENCOUNTER — Encounter: Payer: Self-pay | Admitting: Emergency Medicine

## 2016-01-29 ENCOUNTER — Inpatient Hospital Stay
Admission: EM | Admit: 2016-01-29 | Discharge: 2016-02-21 | DRG: 871 | Disposition: E | Payer: Medicare HMO | Attending: Internal Medicine | Admitting: Internal Medicine

## 2016-01-29 DIAGNOSIS — I4891 Unspecified atrial fibrillation: Secondary | ICD-10-CM | POA: Diagnosis present

## 2016-01-29 DIAGNOSIS — J189 Pneumonia, unspecified organism: Secondary | ICD-10-CM | POA: Diagnosis present

## 2016-01-29 DIAGNOSIS — R748 Abnormal levels of other serum enzymes: Secondary | ICD-10-CM | POA: Diagnosis present

## 2016-01-29 DIAGNOSIS — Z7951 Long term (current) use of inhaled steroids: Secondary | ICD-10-CM

## 2016-01-29 DIAGNOSIS — Z66 Do not resuscitate: Secondary | ICD-10-CM | POA: Diagnosis present

## 2016-01-29 DIAGNOSIS — Z79899 Other long term (current) drug therapy: Secondary | ICD-10-CM

## 2016-01-29 DIAGNOSIS — Z91040 Latex allergy status: Secondary | ICD-10-CM | POA: Diagnosis not present

## 2016-01-29 DIAGNOSIS — R778 Other specified abnormalities of plasma proteins: Secondary | ICD-10-CM

## 2016-01-29 DIAGNOSIS — I82401 Acute embolism and thrombosis of unspecified deep veins of right lower extremity: Secondary | ICD-10-CM

## 2016-01-29 DIAGNOSIS — N179 Acute kidney failure, unspecified: Secondary | ICD-10-CM | POA: Diagnosis present

## 2016-01-29 DIAGNOSIS — I82409 Acute embolism and thrombosis of unspecified deep veins of unspecified lower extremity: Secondary | ICD-10-CM

## 2016-01-29 DIAGNOSIS — R7989 Other specified abnormal findings of blood chemistry: Secondary | ICD-10-CM

## 2016-01-29 DIAGNOSIS — Z888 Allergy status to other drugs, medicaments and biological substances status: Secondary | ICD-10-CM

## 2016-01-29 DIAGNOSIS — A419 Sepsis, unspecified organism: Secondary | ICD-10-CM | POA: Diagnosis present

## 2016-01-29 DIAGNOSIS — I10 Essential (primary) hypertension: Secondary | ICD-10-CM | POA: Diagnosis present

## 2016-01-29 DIAGNOSIS — Z9889 Other specified postprocedural states: Secondary | ICD-10-CM | POA: Diagnosis not present

## 2016-01-29 DIAGNOSIS — Y95 Nosocomial condition: Secondary | ICD-10-CM | POA: Diagnosis present

## 2016-01-29 LAB — APTT: APTT: 36 s (ref 24–36)

## 2016-01-29 LAB — COMPREHENSIVE METABOLIC PANEL
ALK PHOS: 269 U/L — AB (ref 38–126)
ALT: 35 U/L (ref 17–63)
AST: 46 U/L — AB (ref 15–41)
Albumin: 2.6 g/dL — ABNORMAL LOW (ref 3.5–5.0)
Anion gap: 12 (ref 5–15)
BUN: 90 mg/dL — AB (ref 6–20)
CALCIUM: 8.2 mg/dL — AB (ref 8.9–10.3)
CHLORIDE: 118 mmol/L — AB (ref 101–111)
CO2: 16 mmol/L — AB (ref 22–32)
CREATININE: 2.99 mg/dL — AB (ref 0.61–1.24)
GFR calc non Af Amer: 18 mL/min — ABNORMAL LOW (ref >60)
GFR, EST AFRICAN AMERICAN: 20 mL/min — AB (ref >60)
GLUCOSE: 172 mg/dL — AB (ref 65–99)
Potassium: 4.8 mmol/L (ref 3.5–5.1)
SODIUM: 146 mmol/L — AB (ref 135–145)
Total Bilirubin: 1.4 mg/dL — ABNORMAL HIGH (ref 0.3–1.2)
Total Protein: 6.4 g/dL — ABNORMAL LOW (ref 6.5–8.1)

## 2016-01-29 LAB — CBC
HEMATOCRIT: 37.4 % — AB (ref 40.0–52.0)
Hemoglobin: 12.4 g/dL — ABNORMAL LOW (ref 13.0–18.0)
MCH: 34.4 pg — ABNORMAL HIGH (ref 26.0–34.0)
MCHC: 33.3 g/dL (ref 32.0–36.0)
MCV: 103.6 fL — AB (ref 80.0–100.0)
PLATELETS: 197 10*3/uL (ref 150–440)
RBC: 3.61 MIL/uL — AB (ref 4.40–5.90)
RDW: 15.5 % — AB (ref 11.5–14.5)
WBC: 24.9 10*3/uL — AB (ref 3.8–10.6)

## 2016-01-29 LAB — PROTIME-INR
INR: 1.44
Prothrombin Time: 17.6 seconds — ABNORMAL HIGH (ref 11.4–15.0)

## 2016-01-29 LAB — URINE DRUG SCREEN, QUALITATIVE (ARMC ONLY)
AMPHETAMINES, UR SCREEN: NOT DETECTED
BENZODIAZEPINE, UR SCRN: NOT DETECTED
Barbiturates, Ur Screen: NOT DETECTED
COCAINE METABOLITE, UR ~~LOC~~: NOT DETECTED
Cannabinoid 50 Ng, Ur ~~LOC~~: NOT DETECTED
MDMA (Ecstasy)Ur Screen: NOT DETECTED
METHADONE SCREEN, URINE: NOT DETECTED
OPIATE, UR SCREEN: NOT DETECTED
PHENCYCLIDINE (PCP) UR S: NOT DETECTED
Tricyclic, Ur Screen: NOT DETECTED

## 2016-01-29 LAB — DIFFERENTIAL
BASOS ABS: 0.1 10*3/uL (ref 0–0.1)
BASOS PCT: 1 %
Eosinophils Absolute: 0 10*3/uL (ref 0–0.7)
Eosinophils Relative: 0 %
LYMPHS PCT: 2 %
Lymphs Abs: 0.5 10*3/uL — ABNORMAL LOW (ref 1.0–3.6)
MONO ABS: 0.9 10*3/uL (ref 0.2–1.0)
Monocytes Relative: 4 %
NEUTROS ABS: 23.4 10*3/uL — AB (ref 1.4–6.5)
Neutrophils Relative %: 93 %

## 2016-01-29 LAB — LACTIC ACID, PLASMA: Lactic Acid, Venous: 2.9 mmol/L (ref 0.5–1.9)

## 2016-01-29 LAB — ETHANOL: Alcohol, Ethyl (B): <5 mg/dL (ref <5)

## 2016-01-29 LAB — TROPONIN I: Troponin I: 0.12 ng/mL (ref <0.03)

## 2016-01-29 MED ORDER — ASPIRIN 300 MG RE SUPP
300.0000 mg | Freq: Once | RECTAL | Status: AC
  Administered 2016-01-29: 300 mg via RECTAL
  Filled 2016-01-29: qty 1

## 2016-01-29 MED ORDER — ATROPINE SULFATE 1 MG/10ML IJ SOSY
PREFILLED_SYRINGE | INTRAMUSCULAR | Status: AC
  Filled 2016-01-29: qty 10

## 2016-01-29 MED ORDER — APIXABAN 2.5 MG PO TABS: 2.5000 mg | ORAL_TABLET | Freq: Two times a day (BID) | ORAL | Status: DC

## 2016-01-29 MED ORDER — FAMOTIDINE IN NACL 20-0.9 MG/50ML-% IV SOLN
20.0000 mg | Freq: Two times a day (BID) | INTRAVENOUS | Status: DC
  Administered 2016-01-29: 20 mg via INTRAVENOUS
  Filled 2016-01-29: qty 50

## 2016-01-29 MED ORDER — SODIUM CHLORIDE 0.9 % IV BOLUS (SEPSIS): 1000.0000 mL | Freq: Once | INTRAVENOUS | Status: DC

## 2016-01-29 MED ORDER — ATROPINE SULFATE 1 MG/ML IJ SOLN
1.0000 mg | Freq: Once | INTRAMUSCULAR | Status: AC
  Administered 2016-01-29: 1 mg via INTRAVENOUS
  Filled 2016-01-29: qty 1

## 2016-01-29 MED ORDER — SODIUM CHLORIDE 0.45 % IV SOLN
INTRAVENOUS | Status: DC
  Administered 2016-01-29: 11:00:00 via INTRAVENOUS

## 2016-01-29 MED ORDER — SODIUM CHLORIDE 0.9 % IV SOLN
Freq: Once | INTRAVENOUS | Status: AC
  Administered 2016-01-29: 08:00:00 via INTRAVENOUS

## 2016-01-29 MED ORDER — IPRATROPIUM-ALBUTEROL 0.5-2.5 (3) MG/3ML IN SOLN: 3.0000 mL | Freq: Four times a day (QID) | RESPIRATORY_TRACT | Status: DC

## 2016-01-29 MED ORDER — HALOPERIDOL LACTATE 5 MG/ML IJ SOLN
2.0000 mg | Freq: Once | INTRAMUSCULAR | Status: AC
  Administered 2016-01-29: 2 mg via INTRAMUSCULAR
  Filled 2016-01-29: qty 1

## 2016-01-29 MED ORDER — SODIUM CHLORIDE 0.9 % IV BOLUS (SEPSIS)
1000.0000 mL | Freq: Once | INTRAVENOUS | Status: AC
  Administered 2016-01-29: 1000 mL via INTRAVENOUS

## 2016-01-29 MED ORDER — PIPERACILLIN-TAZOBACTAM 3.375 G IVPB 30 MIN
3.3750 g | Freq: Once | INTRAVENOUS | Status: AC
  Administered 2016-01-29: 3.375 g via INTRAVENOUS
  Filled 2016-01-29: qty 50

## 2016-01-29 MED ORDER — ASPIRIN 81 MG PO CHEW
324.0000 mg | CHEWABLE_TABLET | Freq: Once | ORAL | Status: DC
  Filled 2016-01-29: qty 4

## 2016-01-30 LAB — URINE CULTURE: Culture: NO GROWTH

## 2016-02-03 LAB — CULTURE, BLOOD (ROUTINE X 2)
Culture: NO GROWTH
Culture: NO GROWTH

## 2016-02-21 NOTE — Consult Note (Signed)
Pharmacy Antibiotic Note  Caleb GuerinJames C Walker is a [age] y.o. male admitted on 02/15/2016 with pneumonia and sepsis.  Pharmacy has been consulted for zosyn dosing.  Plan: zosyn 3.375g q 12 hours due to renal function. monitor changes in renal function for any needed dosage changes  Height: 6' (182.9 cm) Weight: 184 lb 4.9 oz (83.6 kg) IBW/kg (Calculated) : 77.6  Temp (24hrs), Avg:95.2 F (35.1 C), Min:94.7 F (34.8 C), Max:96.6 F (35.9 C)   Recent Labs Lab 13-Jan-2016 0628 13-Jan-2016 0749  WBC 24.9*  --   CREATININE 2.99*  --   LATICACIDVEN  --  2.9*    Estimated Creatinine Clearance: 19.5 mL/min (by C-G formula based on Cr of 2.99).    Allergies  Allergen Reactions  . Tape     Other reaction(s): OTHER  . Latex Rash    Antimicrobials this admission: zosyn 7/9 >>    Dose adjustments this admission:   Microbiology results: 7/9 BCx: pending 7/9 UCx: pending    Thank you for allowing pharmacy to be a part of this patient's care.  Olene FlossMelissa D Torence Palmeri, Pharm.D Clinical Pharmacist 02/04/2016 10:48 AM

## 2016-02-21 NOTE — Progress Notes (Signed)
LCSW reviewed patient information and will contact [facility] for further information.  Delta Air LinesClaudine Elta Angell LCSW [phone]

## 2016-02-21 NOTE — ED Notes (Signed)
bair hugger applied.

## 2016-02-21 NOTE — ED Provider Notes (Signed)
[facility] Emergency Department Provider Note      ____________________________________________  Time seen: Approximately 7:25 AM  I have reviewed the triage vital signs and the nursing notes.   HISTORY  Chief Complaint Chest Pain    HPI Caleb Walker is a 82 y.o. male patient comes from [facility] with complaint of chest pain. The time I see him he says his pain in his chest is gone he has little bit of neck stiffness. It is not hurting if stiff. Patient has very slurry speech. Recall [facility] to ask them they're unaware of any swelling in his right foot which he also hands. They report his speech is been slurred for several days. Seems to come and go. They're also reports that he coughs when he takes pills but he is able to take pills. Patient's lab work shows a white count 24,000 his chest x-ray shows a possible right-sided infiltrate patient denies coughing but coughs when I sit him up. Patient's right foot and lower leg or edematous with pitting edema other also slightly cooler than the other one left leg. Patient's BUN and creatinine of more than doubled since his last visit. Patient says his right foot is broken but [facility] deny him falling and do not think his foot is broken.   Past Medical History  Diagnosis Date  . Atrial fibrillation (HCC)   . Hypertension   . Hernia, abdominal     Patient Active Problem List   Diagnosis Date Noted  . Hip fracture (HCC) 12/27/2015    Past Surgical History  Procedure Laterality Date  . Hernia repair    . Intramedullary (im) nail intertrochanteric Left 12/28/2015    Procedure: INTRAMEDULLARY (IM) NAIL INTERTROCHANTRIC;  Surgeon: Juanell Fairly, MD;  Location: [facility] ORS;  Service: Orthopedics;  Laterality: Left;    Current Outpatient Rx  Name  Route  Sig  Dispense  Refill  . cholecalciferol (VITAMIN D) 1000 units tablet   Oral   Take 1,000 Units by mouth daily.         Marland Kitchen diltiazem  (CARDIZEM CD) 120 MG 24 hr capsule   Oral   Take 120 mg by mouth daily.         Marland Kitchen enoxaparin (LOVENOX) 40 MG/0.4ML injection   Subcutaneous   Inject 0.4 mLs (40 mg total) into the skin daily.   28 Syringe   0   . ferrous sulfate 325 (65 FE) MG tablet   Oral   Take 1 tablet (325 mg total) by mouth 3 (three) times daily after meals.   90 tablet   3   . finasteride (PROSCAR) 5 MG tablet   Oral   Take 5 mg by mouth daily.         . fluticasone (FLONASE) 50 MCG/ACT nasal spray   Each Nare   Place 2 sprays into both nostrils at bedtime as needed for rhinitis.          Marland Kitchen lisinopril-hydrochlorothiazide (PRINZIDE,ZESTORETIC) 20-12.5 MG tablet   Oral   Take 1 tablet by mouth daily.         . Multiple Vitamin (MULTIVITAMIN WITH MINERALS) TABS tablet   Oral   Take 1 tablet by mouth daily.         Marland Kitchen ULORIC 80 MG TABS   Oral   Take 80 mg by mouth daily.      3     Dispense as written.   . vitamin B-12 (CYANOCOBALAMIN) 1000 MCG  tablet   Oral   Take 1,000 mcg by mouth daily.         . vitamin C (ASCORBIC ACID) 500 MG tablet   Oral   Take 500 mg by mouth daily.         . vitamin E 400 UNIT capsule   Oral   Take 400 Units by mouth daily.         Marland Kitchen oxyCODONE-acetaminophen (ROXICET) 5-325 MG tablet   Oral   Take 1 tablet by mouth every 4 (four) hours as needed for moderate pain or severe pain.   30 tablet   0     Allergies Latex  History reviewed. No pertinent family history.  Social History Social History  Substance Use Topics  . Smoking status: Never Smoker   . Smokeless tobacco: None  . Alcohol Use: No    Review of Systems Constitutional: No fever/chills Eyes: No visual changes. ENT: No sore throat. Cardiovascular: Denies chest pain. Respiratory: Denies shortness of breath. Gastrointestinal: No abdominal pain.  No nausea, no vomiting.  No diarrhea.  No constipation. Genitourinary: Negative for dysuria. Musculoskeletal: Negative for back  pain. Skin: Negative for rash. Neurological: Negative for headaches, focal weakness or numbness.  10-point ROS otherwise negative.  ____________________________________________   PHYSICAL EXAM:  VITAL SIGNS: ED Triage Vitals  Enc Vitals Group     BP 2016/02/18 0629 109/81 mmHg     Pulse Rate 2016-02-18 0621 25     Resp February 18, 2016 0618 27     Temp February 18, 2016 0629 96.6 F (35.9 C)     Temp Source 02/18/16 0629 Rectal     SpO2 2016/02/18 0621 95 %     Weight 2016/02/18 0629 200 lb (90.719 kg)     Height 02/18/2016 0629 6' (1.829 m)     Head Cir --      [facility] --      Pain Score --      Pain Loc --      Pain Edu? --      Excl. in GC? --     Constitutional: Alert and oriented. Well appearing and in no acute distress. Eyes: Conjunctivae are normal. PERRL. EOMI. Head: Atraumatic. Nose: No congestion/rhinnorhea. Mouth/Throat: Mucous membranes are moist.  Oropharynx non-erythematous. Neck: No stridor. Cardiovascular: Slightly tachycardic regular rhythm. Grossly normal heart sounds.  Good peripheral circulation. Respiratory: Normal respiratory effort.  No retractions. Lungs CAD or crackles Gastrointestinal: Soft and nontender. No distention. No abdominal bruits. No CVA tenderness. Musculoskeletal: No lower extremity tenderness right leg is slightly edematous below the knee more edematous further down slightly cooler than the other leg.  No joint effusions. Neurologic:  Speech is intelligible but slurry and language. No gross focal neurologic deficits are appreciated.  Skin:  Skin is warm, dry and intact. No rash noted. Psychiatric: Mood and affect are normal. Speech and behavior are normal.  ____________________________________________   LABS (all labs ordered are listed, but only abnormal results are displayed)  Labs Reviewed  CBC - Abnormal; Notable for the following:    WBC 24.9 (*)    RBC 3.61 (*)    Hemoglobin 12.4 (*)    HCT 37.4 (*)    MCV 103.6 (*)    MCH 34.4 (*)    RDW  15.5 (*)    All other components within normal limits  DIFFERENTIAL - Abnormal; Notable for the following:    Neutro Abs 23.4 (*)    Lymphs Abs 0.5 (*)    All other components  within normal limits  COMPREHENSIVE METABOLIC PANEL - Abnormal; Notable for the following:    Sodium 146 (*)    Chloride 118 (*)    CO2 16 (*)    Glucose, Bld 172 (*)    BUN 90 (*)    Creatinine, Ser 2.99 (*)    Calcium 8.2 (*)    Total Protein 6.4 (*)    Albumin 2.6 (*)    AST 46 (*)    Alkaline Phosphatase 269 (*)    Total Bilirubin 1.4 (*)    GFR calc non Af Amer 18 (*)    GFR calc Af Amer 20 (*)    All other components within normal limits  TROPONIN I - Abnormal; Notable for the following:    Troponin I 0.12 (*)    All other components within normal limits  PROTIME-INR - Abnormal; Notable for the following:    Prothrombin Time 17.6 (*)    All other components within normal limits  LACTIC ACID, PLASMA - Abnormal; Notable for the following:    Lactic Acid, Venous 2.9 (*)    All other components within normal limits  CULTURE, BLOOD (ROUTINE X 2)  CULTURE, BLOOD (ROUTINE X 2)  URINE CULTURE  ETHANOL  APTT  URINE DRUG SCREEN, QUALITATIVE ([facility] ONLY)  LACTIC ACID, PLASMA   ____________________________________________  EKG  EKG read and interpreted by me shows sinus tach at a rate of 106 normal axis nonspecific ST-T wave changes ____________________________________________  RADIOLOGY  Chest x-ray read by radiology reviewed by me shows a vague right sided infiltrate like density ____________________________________________   PROCEDURES    Procedures   ____________________________________________   INITIAL IMPRESSION / ASSESSMENT AND PLAN / ED COURSE  Pertinent labs & imaging results that were available during my care of the patient were reviewed by me and considered in my medical decision making (see chart for details).   ____________________________________________   FINAL  CLINICAL IMPRESSION(S) / ED DIAGNOSES  Final diagnoses:  Healthcare-associated pneumonia  Sepsis, due to unspecified organism (HCC)  Acute deep vein thrombosis (DVT) of other vein of lower extremity (HCC)      NEW MEDICATIONS STARTED DURING THIS VISIT:  New Prescriptions   No medications on file     Note:  This document was prepared using Dragon voice recognition software and may include unintentional dictation errors.    Arnaldo NatalPaul F Malaak Stach, MD 2015/12/21 1026

## 2016-02-21 NOTE — Progress Notes (Signed)
LCSW was present to complete assessment consulted with ED nurse and was informed patient passed away. [facility] as Dr Darnelle CatalanMalinda speaking with family.  No further needs  Delta Air LinesClaudine Lenita Peregrina LCSW [phone]

## 2016-02-21 NOTE — ED Notes (Signed)
 1mg  of atropine given at this time [facility]

## 2016-02-21 NOTE — ED Provider Notes (Signed)
Patient was being worked up for admission he came acutely bradycardic he received atropine for bradycardia but that did not really seem to affect things much. Patient became apneic and rapidly passed away. I notified the patient's son at about 3 minutes before noon. She started that was 1150.  Arnaldo NatalPaul F Hadlyn Amero, MD 02-09-2016 1201

## 2016-02-21 NOTE — ED Notes (Signed)
MD Sparks notified of BP 91/60, no new orders at this time, will continue to monitor

## 2016-02-21 NOTE — ED Notes (Signed)
RN attempted to call report at this time, was told to call back after nurse looks over chart.

## 2016-02-21 NOTE — ED Notes (Addendum)
Pt found to be bradycardia and unresponsive, MD at bedside pt not kusmal breathing at this time.

## 2016-02-21 NOTE — ED Notes (Signed)
Time of death called at this time. [facility] emergency contact at this time

## 2016-02-21 NOTE — Care Management Note (Signed)
Case Management Note  Patient Details  Name: Caleb Walker MRN: [id number] Date of Birth: 10/03/1926  Subjective/Objective:      Patient expired in the ER. No admission review required. Case closed.              Action/Plan:   Expected Discharge Date:                  Expected Discharge Plan:     In-House Referral:     Discharge planning Services     Post Acute Care Choice:    Choice offered to:     DME Arranged:    DME Agency:     HH Arranged:    HH Agency:     Status of Service:     If discussed at [employer] Length of Stay Meetings, dates discussed:    Additional Comments:  Caren MacadamMichelle Kenzee Bassin, RN 01/30/2016, 1:23 PM

## 2016-02-21 NOTE — H&P (Signed)
History and Physical    Caleb Walker DOB: 29-Nov-1926 DOA: 2016/02/13  Referring physician: Dr. Darnelle Catalan PCP: Lauro Regulus., MD  Specialists: none  Chief Complaint: unresponsiveness  HPI: Caleb Walker is a 82 y.o. male has a past medical history significant for A-fib, HTN, and hip surgery now from SNF with unresponsiveness. In the ER, there is no family present and pt is currently non-verbal. ER work up reveals ARF with pneumonia on CXR and an acute RLE DVT. His troponin was also elevated. He is a DNR at the SNF. He is now admitted.  Review of Systems: unable to obtain due to non-verbal status  Past Medical History  Diagnosis Date  . Atrial fibrillation (HCC)   . Hypertension   . Hernia, abdominal    Past Surgical History  Procedure Laterality Date  . Hernia repair    . Intramedullary (im) nail intertrochanteric Left 12/28/2015    Procedure: INTRAMEDULLARY (IM) NAIL INTERTROCHANTRIC;  Surgeon: Juanell Fairly, MD;  Location: [facility] ORS;  Service: Orthopedics;  Laterality: Left;   Social History:  reports that he has never smoked. He does not have any smokeless tobacco history on file. He reports that he does not drink alcohol. His drug history is not on file.  Allergies  Allergen Reactions  . Tape     Other reaction(s): OTHER  . Latex Rash    History reviewed. No pertinent family history.  Prior to Admission medications   Medication Sig Start Date End Date Taking? Authorizing Provider  cholecalciferol (VITAMIN D) 1000 units tablet Take 1,000 Units by mouth daily.   Yes Historical Provider, MD  diltiazem (CARDIZEM CD) 120 MG 24 hr capsule Take 120 mg by mouth daily.   Yes Historical Provider, MD  enoxaparin (LOVENOX) 40 MG/0.4ML injection Inject 0.4 mLs (40 mg total) into the skin daily. 12/30/15  Yes Enedina Finner, MD  ferrous sulfate 325 (65 FE) MG tablet Take 1 tablet (325 mg total) by mouth 3 (three) times daily after meals. 12/30/15  Yes Enedina Finner, MD   finasteride (PROSCAR) 5 MG tablet Take 5 mg by mouth daily.   Yes Historical Provider, MD  fluticasone (FLONASE) 50 MCG/ACT nasal spray Place 2 sprays into both nostrils at bedtime as needed for rhinitis.    Yes Historical Provider, MD  lisinopril-hydrochlorothiazide (PRINZIDE,ZESTORETIC) 20-12.5 MG tablet Take 1 tablet by mouth daily. 12/26/15  Yes Historical Provider, MD  Multiple Vitamin (MULTIVITAMIN WITH MINERALS) TABS tablet Take 1 tablet by mouth daily.   Yes Historical Provider, MD  ULORIC 80 MG TABS Take 80 mg by mouth daily. 11/29/15  Yes Historical Provider, MD  vitamin B-12 (CYANOCOBALAMIN) 1000 MCG tablet Take 1,000 mcg by mouth daily.   Yes Historical Provider, MD  vitamin C (ASCORBIC ACID) 500 MG tablet Take 500 mg by mouth daily.   Yes Historical Provider, MD  vitamin E 400 UNIT capsule Take 400 Units by mouth daily.   Yes Historical Provider, MD  oxyCODONE-acetaminophen (ROXICET) 5-325 MG tablet Take 1 tablet by mouth every 4 (four) hours as needed for moderate pain or severe pain. 12/30/15   Enedina Finner, MD   Physical Exam: Filed Vitals:   02-13-2016 0845 02-13-16 0945 February 13, 2016 1000 02/13/16 1015  BP: 114/89 127/81 128/75 130/71  Pulse:  102 104   Temp: 95.1 F (35.1 C) 94.9 F (34.9 C) 94.7 F (34.8 C) 94.7 F (34.8 C)  TempSrc:      Resp: 25 27 25 20   Height:  Weight:      SpO2:  96% 100%      General:  W[place], [place]/AT, acutely ill appearing in moderate respiratory distress  Eyes: PERRL, EOMI, no scleral icterus, conjunctiva clear  ENT: dry oropharynx without lesions, TM's benign, dentition poor  Neck: supple, no lymphadenopathy. No bruits or thyromegaly  Cardiovascular: rapid rate with regular rhythm without MRG; 2+ peripheral pulses, no JVD, 2+ peripheral edema on right, trace on left  Respiratory: diffuse rhonchi with faint basilar rales. No wheezing or dullness. Respiratory effort increased.  Abdomen: soft, non tender to palpation, positive bowel sounds, no  guarding, no rebound  Skin: no rashes or lesions  Musculoskeletal: normal bulk and tone, no joint swelling  Psychiatric: unable to assess due to non-verbal status  Neurologic: CN 2-12 grossly intact, Motor strength 5/5 in all 4 groups with symmetric DTR's and non-focal sensory exam  Labs on Admission:  Basic Metabolic Panel:  Recent Labs Lab 12-29-15 0628  NA 146*  K 4.8  CL 118*  CO2 16*  GLUCOSE 172*  BUN 90*  CREATININE 2.99*  CALCIUM 8.2*   Liver Function Tests:  Recent Labs Lab 12-29-15 0628  AST 46*  ALT 35  ALKPHOS 269*  BILITOT 1.4*  PROT 6.4*  ALBUMIN 2.6*   No results for input(s): LIPASE, AMYLASE in the last 168 hours. No results for input(s): AMMONIA in the last 168 hours. CBC:  Recent Labs Lab 12-29-15 0628  WBC 24.9*  NEUTROABS 23.4*  HGB 12.4*  HCT 37.4*  MCV 103.6*  PLT 197   Cardiac Enzymes:  Recent Labs Lab 12-29-15 0628  TROPONINI 0.12*    BNP (last 3 results) No results for input(s): BNP in the last 8760 hours.  ProBNP (last 3 results) No results for input(s): PROBNP in the last 8760 hours.  CBG: No results for input(s): GLUCAP in the last 168 hours.  Radiological Exams on Admission: Dg Tibia/fibula Right  02/19/2016  CLINICAL DATA:  Lower extremity edema EXAM: RIGHT TIBIA AND FIBULA - 2 VIEW COMPARISON:  None. FINDINGS: Frontal and lateral views were obtained. There is no fracture or dislocation. The joint spaces appear normal. No knee joint effusion. There are scattered foci of vascular calcification. No abnormal periosteal reaction. IMPRESSION: No fracture or dislocation. No apparent arthropathy. Scattered foci of atherosclerotic calcification noted. Electronically Signed   By: Bretta BangWilliam  Woodruff III M.D.   On: 2015-10-18 09:31   Ct Head Wo Contrast  01/27/2016  CLINICAL DATA:  Slurred speech and confusion EXAM: CT HEAD WITHOUT CONTRAST TECHNIQUE: Contiguous axial images were obtained from the base of the skull through the  vertex without intravenous contrast. COMPARISON:  None. FINDINGS: Brain: There is moderate diffuse atrophy. There is no intracranial mass, hemorrhage, extra-axial fluid collection, or midline shift. There is patchy small vessel disease in the centra semiovale bilaterally. Elsewhere gray-white compartments appear normal. No acute infarct evident. Vascular: There are foci of cavernous carotid artery calcification bilaterally. No hyperdense vessels identified. Skull: Bony calvarium appears intact. Sinuses/Orbits: Orbits appear symmetric bilaterally. There is mucosal thickening in the posterior left maxillary antrum. There is also mucosal thickening in the left inferior aspect of the sphenoid sinus. Other paranasal sinuses are clear. Other: Mastoid air cells are clear. There is moderate falcine calcification, a benign finding. IMPRESSION: Atrophy with periventricular small vessel disease. No intracranial mass, hemorrhage, or acute appearing infarct. Foci of arterial vascular calcification in the cavernous carotid arteries. Mild paranasal sinus disease. Electronically Signed   By: Bretta BangWilliam  Woodruff III M.D.  On: Feb 22, 2016 09:35   US Venous Img Lower Unilateral Right  February 22, 2016  CLINICAL DATA:  Right lower extremity edema EXAM: RIGHT LOWER EXTREMITY VENOUS DUPLEX ULTRASOUND TECHNIQUE: Gray-scale sonography with graded compression, as well as color Doppler and duplex ultrasound were performed to evaluate the right lower extremity deep venous system from the level of the common femoral vein and including the common femoral, femoral, profunda femoral, popliteal and calf veins including the posterior tibial, peroneal and gastrocnemius veins when visible. The superficial great saphenous vein was also interrogated. Spectral Doppler was utilized to evaluate flow at rest and with distal augmentation maneuvers in the common femoral, femoral and popliteal veins. COMPARISON:  None. FINDINGS: Contralateral Common Femoral Vein:  Respiratory phasicity is normal and symmetric with the symptomatic side. No evidence of thrombus. Normal compressibility. Common Femoral Vein: No evidence of thrombus. Normal compressibility, respiratory phasicity and response to augmentation. Saphenofemoral Junction: No evidence of thrombus. Normal compressibility and flow on color Doppler imaging. Profunda Femoral Vein: No evidence of thrombus. Normal compressibility and flow on color Doppler imaging. Femoral Vein: There is thrombus throughout the femoral vein wall which appears acute. There is loss of compression and augmentation in this vessel. There is significant diminution of Doppler signal. Popliteal Vein: There is thrombus throughout the right popliteal femoral vein with loss of Doppler signal and absence of compression and augmentation. Calf Veins: Thrombus is seen at the visualized calf veins with absence of augmentation and compression. Loss of Doppler signal. Superficial Great Saphenous Vein: No evidence of thrombus. Normal compressibility and flow on color Doppler imaging. Venous Reflux:  None. Other Findings:  None. IMPRESSION: There is acute appearing deep venous thrombosis throughout the right femoral vein, popliteal vein, and calf veins. Critical Value/emergent results were called by telephone at the time of interpretation on February 22, 2016 at 10:20 am to Dr. Dorothea Glassman , who verbally acknowledged these results. Electronically Signed   By: Bretta Bang III M.D.   On: Feb 22, 2016 10:21   Dg Chest Port 1 View  02-22-2016  CLINICAL DATA:  Acute onset of generalized chest pain. Initial encounter. EXAM: PORTABLE CHEST 1 VIEW COMPARISON:  Chest radiograph performed 12/27/2015 FINDINGS: The lungs are well-aerated. Vague density at the right midlung zone could reflect mild pneumonia, depending on the patient's symptoms. Lucency at the left lung apex appears to reflect the degree of penetration on this study. There is no evidence of pleural effusion or  pneumothorax. The cardiomediastinal silhouette is within normal limits. No acute osseous abnormalities are seen. IMPRESSION: Vague density at the right midlung zone could reflect mild pneumonia, depending on the patient's symptoms. Electronically Signed   By: Roanna Raider M.D.   On: 02/22/2016 06:42   Dg Foot Complete Right  02-22-16  CLINICAL DATA:  Right lower extremity and right foot edema. No reported injury. EXAM: RIGHT FOOT COMPLETE - 3+ VIEW COMPARISON:  None. FINDINGS: Diffuse dorsal right foot and ankle soft tissue swelling. No fracture, dislocation, suspicious focal osseous lesion or appreciable arthropathy. Tiny plantar right calcaneal spur. No radiopaque foreign body. IMPRESSION: Diffuse dorsal right foot and ankle soft tissue swelling. No fracture, malalignment or appreciable arthropathy. Electronically Signed   By: Delbert Phenix M.D.   On: February 22, 2016 09:31    EKG: Independently reviewed.  Assessment/Plan Principal Problem:   ARF (acute renal failure) (HCC) Active Problems:   HCAP (healthcare-associated pneumonia)   Deep vein thrombosis (DVT) of right lower extremity (HCC)   Elevated troponin   Will admit to floor as DNR with  O2, IV fluids, IV ABX, and SVN's. ASA suppository and SQ Heparin for now given ARF and high risk of bleeding. Once cleared by ST, will begin Eliquis. Follow enzymes, check echo. Consult Cardiology and Nephrology. Prognosis oor.  Diet: NPO Fluids: 1/2 NS@100  DVT Prophylaxis: SQ Heparin  Code Status: DNR  Family Communication: none  Disposition Plan: SNF  Time spent: 55 min

## 2016-02-21 NOTE — ED Notes (Signed)
Pulses found on bilateral extremities using doppler

## 2016-02-21 NOTE — ED Notes (Signed)
Patient presents to Emergency Department via EMS with complaints of chest pain.  Pt from [facility], pt confused speech, refers to drinking a pepsi, apneic periods. Lungs base clear no audible wheeze, audible bowels sounds, responds to loud speech.

## 2016-02-21 NOTE — ED Notes (Signed)
MD Eilleen KempfM alinda at bedside with doppler, positive cardiac activity at this time with no resp or HR

## 2016-02-21 DEATH — deceased

## 2016-10-23 IMAGING — DX DG CHEST 1V PORT
1 series · 1 of 1 positions shown · non-contrast
Comparison: Chest radiograph performed 12/27/2015

CLINICAL DATA: Acute onset of generalized chest pain. Initial
encounter.

EXAM:
PORTABLE CHEST 1 VIEW

[chest ap]
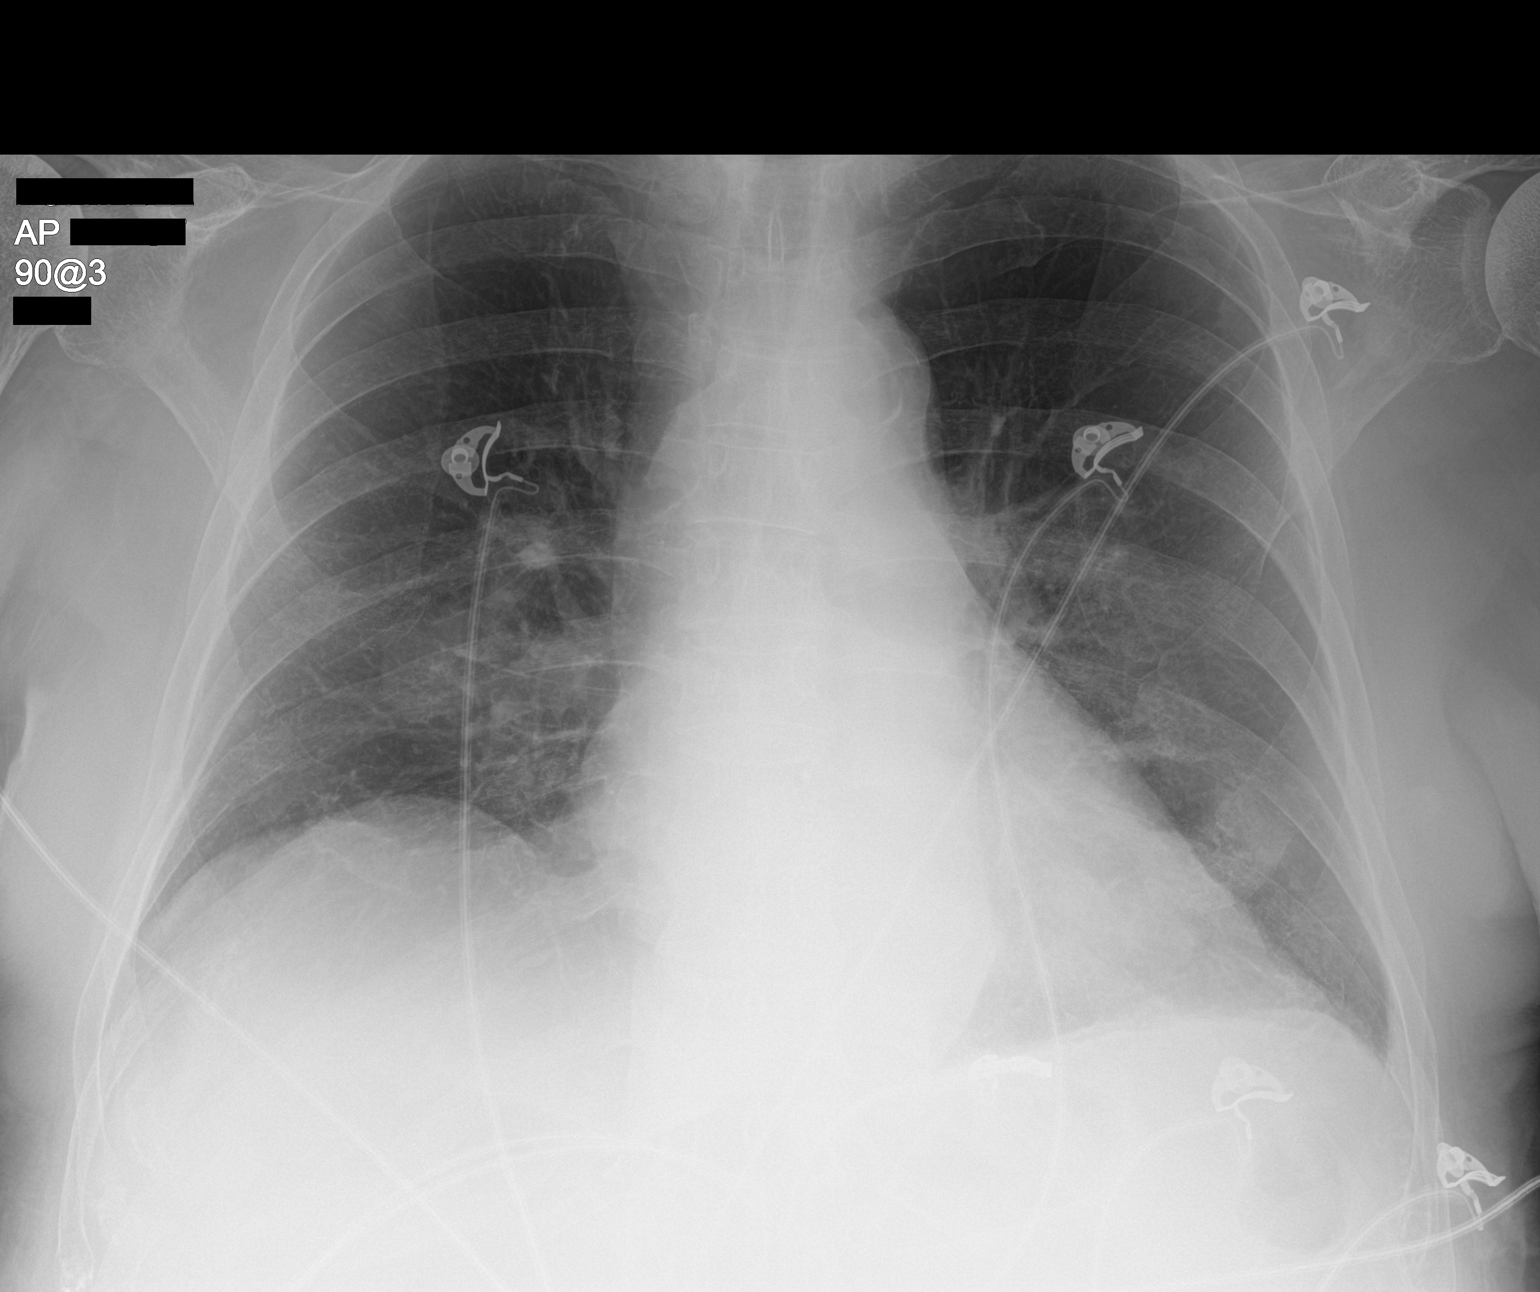

[1 of 1 positions shown; findings below may reference images not displayed]

FINDINGS: The lungs are well-aerated. Vague density at the right midlung zone
could reflect mild pneumonia, depending on the patient's symptoms.
Lucency at the left lung apex appears to reflect the degree of
penetration on this study. There is no evidence of pleural effusion
or pneumothorax.

The cardiomediastinal silhouette is within normal limits. No acute
osseous abnormalities are seen.
IMPRESSION: Vague density at the right midlung zone could reflect mild
pneumonia, depending on the patient's symptoms.

## 2016-10-23 IMAGING — CR DG TIBIA/FIBULA 2V*R*
4 series · 4 of 4 positions shown · non-contrast
Comparison: None.

CLINICAL DATA: Lower extremity edema

EXAM:
RIGHT TIBIA AND FIBULA - 2 VIEW

[tibia ap (1 of 2)]
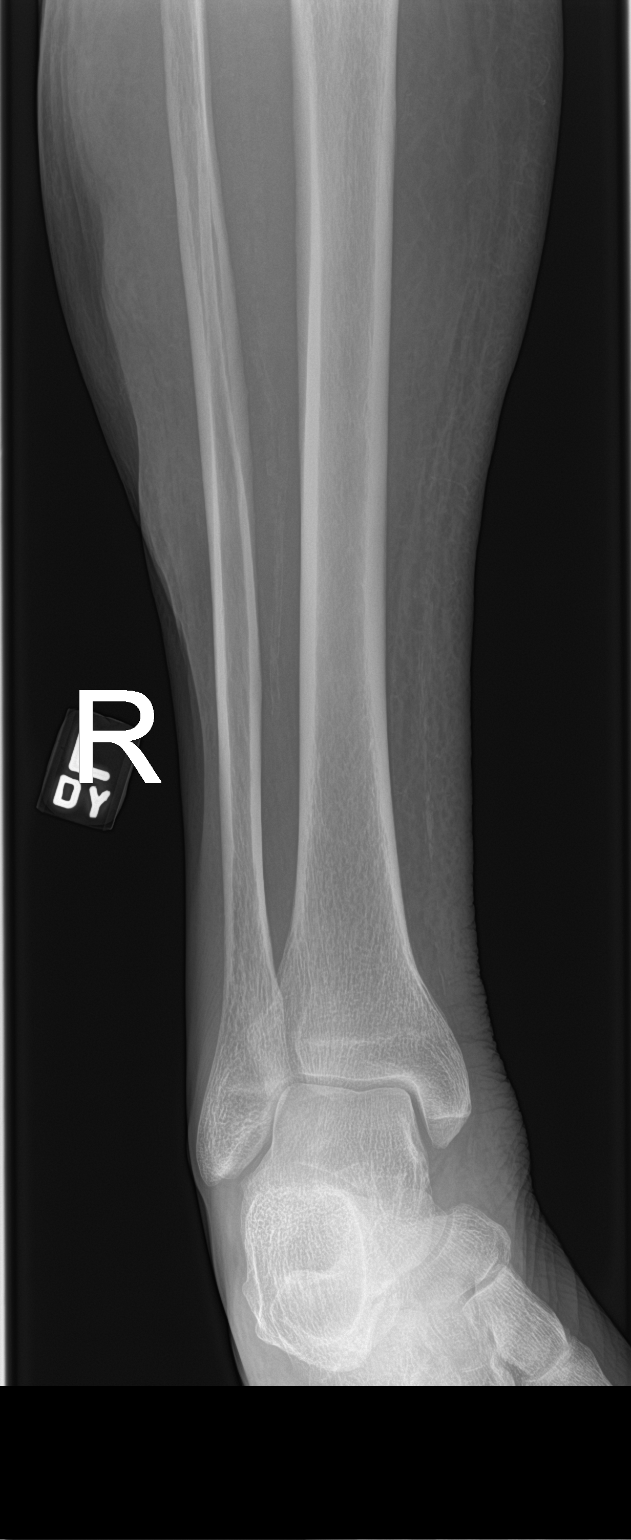

[tibia ap (2 of 2)]
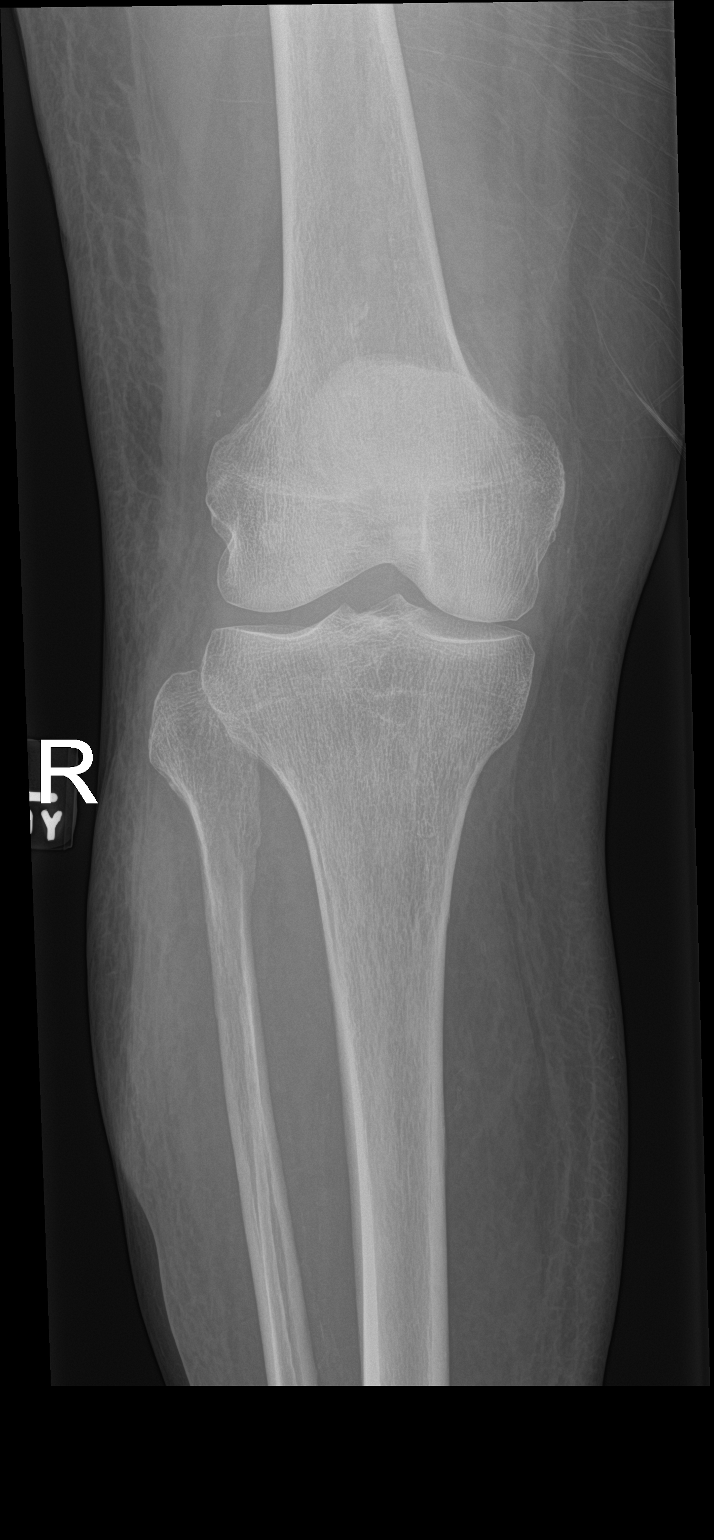

[tibia lat (1 of 2)]
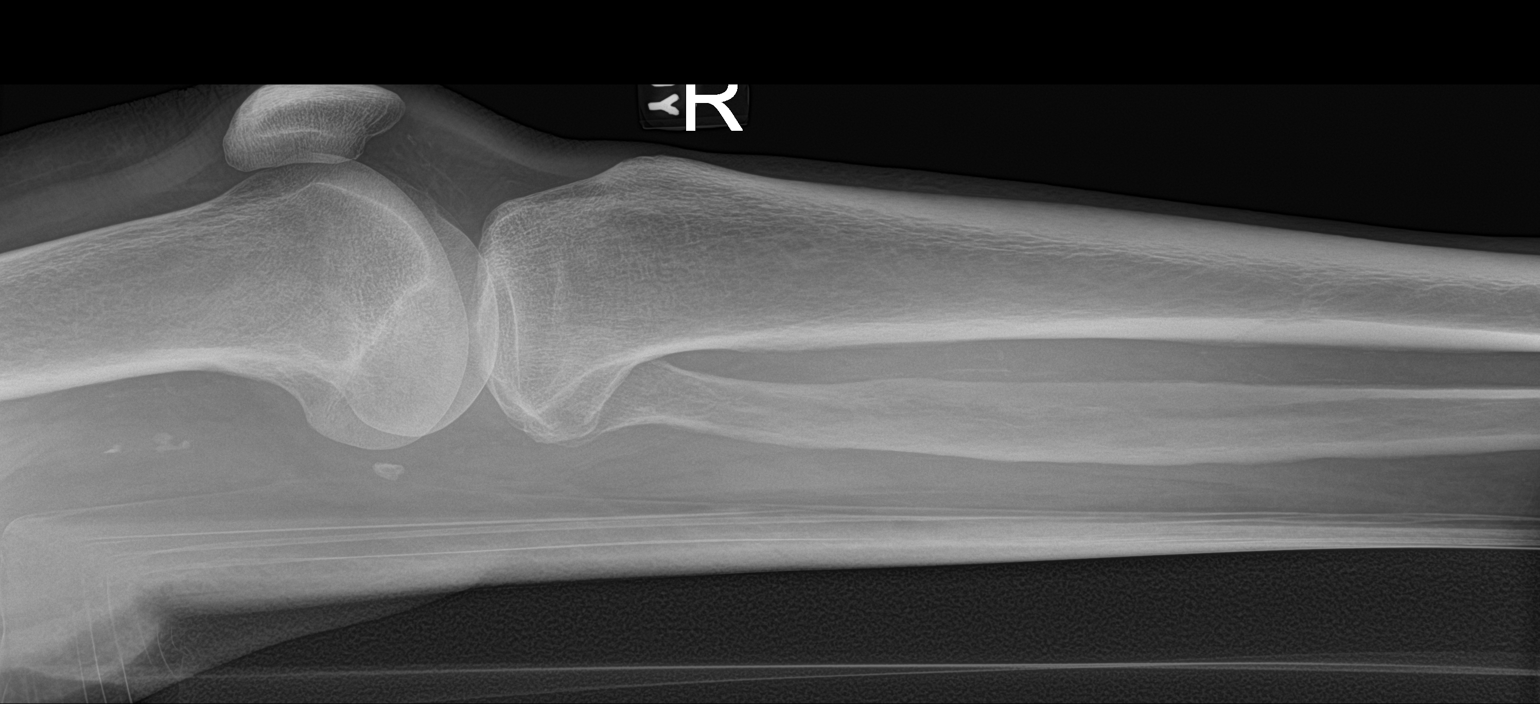

[tibia lat (2 of 2)]
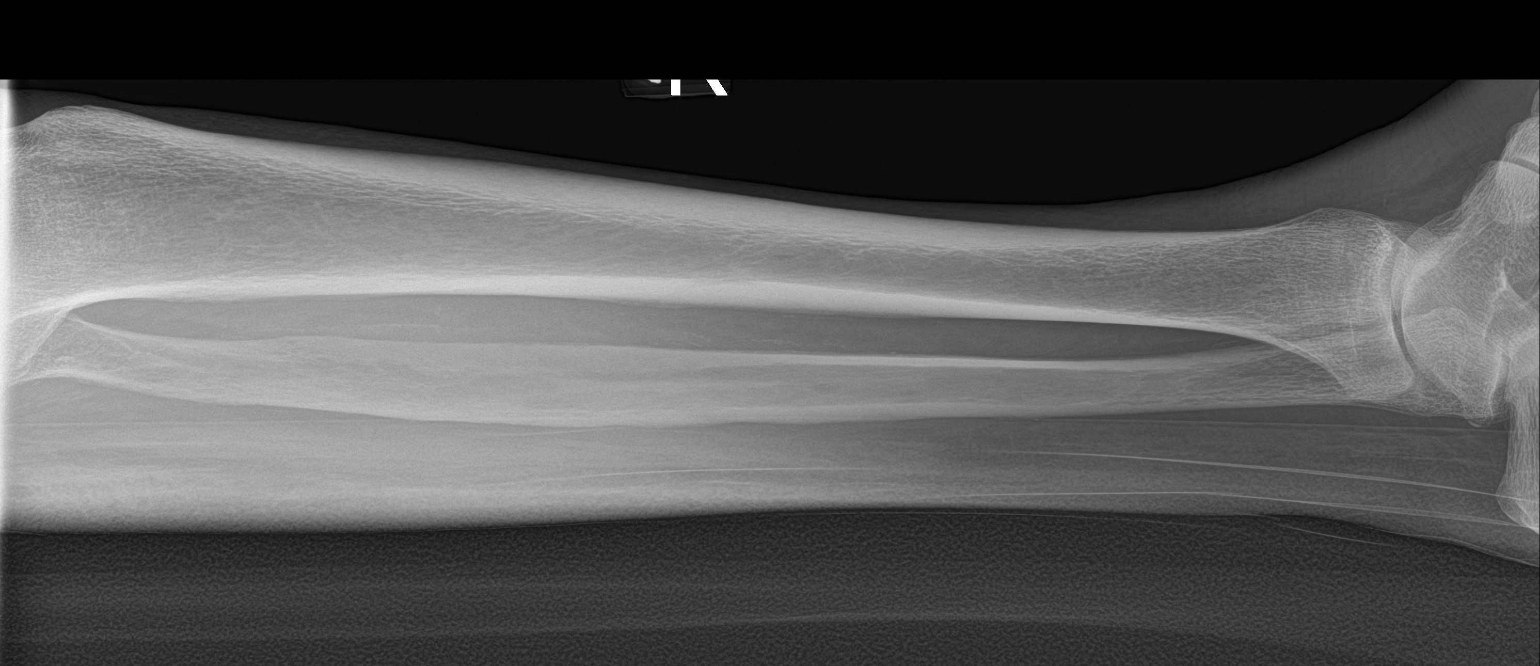

[4 of 4 positions shown; findings below may reference images not displayed]

FINDINGS: Frontal and lateral views were obtained. There is no fracture or
dislocation. The joint spaces appear normal. No knee joint effusion.
There are scattered foci of vascular calcification. No abnormal
periosteal reaction.
IMPRESSION: No fracture or dislocation. No apparent arthropathy. Scattered foci
of atherosclerotic calcification noted.
